# Patient Record
Sex: Male | Born: 1975 | Race: White | Hispanic: No | Marital: Married | Smoking: Former smoker
Health system: Southern US, Community
[De-identification: ages and names within clinical notes are randomized; demographics above are authoritative.]

## PROBLEM LIST (undated history)

## (undated) DIAGNOSIS — R011 Cardiac murmur, unspecified: Secondary | ICD-10-CM

## (undated) DIAGNOSIS — M5412 Radiculopathy, cervical region: Secondary | ICD-10-CM

## (undated) DIAGNOSIS — Z8709 Personal history of other diseases of the respiratory system: Secondary | ICD-10-CM

## (undated) DIAGNOSIS — I10 Essential (primary) hypertension: Secondary | ICD-10-CM

## (undated) DIAGNOSIS — J45909 Unspecified asthma, uncomplicated: Secondary | ICD-10-CM

## (undated) DIAGNOSIS — Z8739 Personal history of other diseases of the musculoskeletal system and connective tissue: Secondary | ICD-10-CM

## (undated) HISTORY — DX: Personal history of other diseases of the musculoskeletal system and connective tissue: Z87.39

## (undated) HISTORY — DX: Unspecified asthma, uncomplicated: J45.909

## (undated) HISTORY — PX: BACK SURGERY: SHX140

## (undated) HISTORY — DX: Cardiac murmur, unspecified: R01.1

## (undated) HISTORY — DX: Personal history of other diseases of the respiratory system: Z87.09

## (undated) HISTORY — DX: Radiculopathy, cervical region: M54.12

---

## 1998-12-22 ENCOUNTER — Emergency Department (HOSPITAL_COMMUNITY): Admission: EM | Admit: 1998-12-22 | Discharge: 1998-12-22 | Payer: Self-pay | Admitting: Emergency Medicine

## 1998-12-22 ENCOUNTER — Encounter: Payer: Self-pay | Admitting: Emergency Medicine

## 1999-01-25 ENCOUNTER — Emergency Department (HOSPITAL_COMMUNITY): Admission: EM | Admit: 1999-01-25 | Discharge: 1999-01-25 | Payer: Self-pay

## 1999-07-19 ENCOUNTER — Emergency Department (HOSPITAL_COMMUNITY): Admission: EM | Admit: 1999-07-19 | Discharge: 1999-07-19 | Payer: Self-pay | Admitting: Emergency Medicine

## 1999-08-08 ENCOUNTER — Emergency Department (HOSPITAL_COMMUNITY): Admission: EM | Admit: 1999-08-08 | Discharge: 1999-08-09 | Payer: Self-pay | Admitting: Emergency Medicine

## 2000-11-09 ENCOUNTER — Emergency Department (HOSPITAL_COMMUNITY): Admission: EM | Admit: 2000-11-09 | Discharge: 2000-11-09 | Payer: Self-pay | Admitting: *Deleted

## 2003-11-27 ENCOUNTER — Emergency Department (HOSPITAL_COMMUNITY): Admission: EM | Admit: 2003-11-27 | Discharge: 2003-11-27 | Payer: Self-pay | Admitting: Emergency Medicine

## 2007-11-02 ENCOUNTER — Emergency Department (HOSPITAL_COMMUNITY): Admission: EM | Admit: 2007-11-02 | Discharge: 2007-11-02 | Payer: Self-pay | Admitting: Emergency Medicine

## 2008-03-05 ENCOUNTER — Emergency Department (HOSPITAL_COMMUNITY): Admission: EM | Admit: 2008-03-05 | Discharge: 2008-03-05 | Payer: Self-pay | Admitting: Emergency Medicine

## 2008-10-19 ENCOUNTER — Emergency Department (HOSPITAL_BASED_OUTPATIENT_CLINIC_OR_DEPARTMENT_OTHER): Admission: EM | Admit: 2008-10-19 | Discharge: 2008-10-20 | Payer: Self-pay | Admitting: Emergency Medicine

## 2009-05-19 ENCOUNTER — Emergency Department (HOSPITAL_BASED_OUTPATIENT_CLINIC_OR_DEPARTMENT_OTHER): Admission: EM | Admit: 2009-05-19 | Discharge: 2009-05-19 | Payer: Self-pay | Admitting: Emergency Medicine

## 2011-04-10 NOTE — Consult Note (Signed)
NAME:  GREGOR, DERSHEM NO.:  [id number]   MEDICAL RECORD NO.:  [id number]          PATIENT TYPE:  EMS   LOCATION:  ED                            FACILITY:  [facility]   PHYSICIAN:  Dionne Ano. Gramig, M.D.DATE OF BIRTH:  Aug 20, 1976   DATE OF CONSULTATION:  10/20/2008  DATE OF DISCHARGE:  10/20/2008                                 CONSULTATION   I had the pleasure to see Elijah Pennington today in [facility] Emergency  Room.  Elijah Pennington is a pleasant male who is 35 years of age and was bit by  cat 24 hours ago about his left hypothenar and dorsal small finger MCP  region.  He has acute pain, swelling, and loss of function.  He was seen  by emergency room staff and I was asked to see in consult in regards to  his upper extremity predicament.  He notes no previous history of injury  or other problems.   PAST MEDICAL HISTORY:  He denies any significant past medical problems  of note.   Past surgical history is reviewed for minor operations only.   ALLERGIES:  PENICILLIN.   MEDICINES:  None.   SOCIAL HISTORY:  He works at a IT sales professional.   PHYSICAL EXAMINATION:  EXTREMITIES:  Normal right upper extremity  examination, lower extremity examination is benign.  CHEST:  Clear.  HEART:  Regular rate.  ABDOMEN:  Nontender.   The left upper extremity has hypothenar abscess from a cat bite and  dorsal scratches as well as a small abscess over MCP region which is  fairly superficial.  I have reviewed this at length.  His x-rays are  negative for fracture dislocation or specifying lesion.  There is no  frank Kanavel's signs but certainly on his exam, he has a deep abscess.   IMPRESSION:  Deep abscess secondary to cat bite 24 hours out.   PLAN:  I have discussed with him his findings.  We will send him for I&D  and repair as necessary.   He was taken to the procedure suite and underwent a ulnar nerve block at  the wrist level with lidocaine without  epinephrine.  He was then  scrubbed with Betadine scrub and paint and then he underwent an  incision.  The incision was performed over the cat bite.  There was  large amount of purulent material and this was cultured for aerobic and  anaerobic culture.  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   He was decompressed, nicely irrigated with multiple amounts of saline  and packed with iodoform gauze.  This was packed deeply into the  hypothenar region.   The patient had a similar procedure performed about the MCP joint,  however, this was only superficial.  The hypothenar I&D include the  skin, subcutaneous tissue, muscle, the MCP joint skin full thickness in  nature and a small amount of subcu.   The wounds were dressed sterilely.  There were no complicating features  with his surgical I&D and I have discussed with him doxycycline 100 mg  b.i.d. p.o.  as well as elevation, observation, Percocet for pain,  vitamin C, stool softener and return to the office to see me in 24 hours  for whirlpool and continued aggressive wound care.  We will call him to  arrange this immediately tomorrow.  I have discussed with him these  issues at length.  I would expect a long course for secondary intent  healing of the hypothenar region secondary to his injuries.  It was a  pleasure to see him today and all questions have been encouraged and  answered.      Dionne Ano. Amanda Pea, M.D.  Electronically Signed     WMG/MEDQ  D:  10/20/2008  T:  10/20/2008  Job:  [id number]

## 2011-08-28 LAB — CULTURE, ROUTINE-ABSCESS: Gram Stain: NONE SEEN

## 2012-07-18 ENCOUNTER — Encounter (HOSPITAL_BASED_OUTPATIENT_CLINIC_OR_DEPARTMENT_OTHER): Payer: Self-pay | Admitting: *Deleted

## 2012-07-18 ENCOUNTER — Emergency Department (HOSPITAL_BASED_OUTPATIENT_CLINIC_OR_DEPARTMENT_OTHER): Payer: 59

## 2012-07-18 ENCOUNTER — Emergency Department (HOSPITAL_BASED_OUTPATIENT_CLINIC_OR_DEPARTMENT_OTHER)
Admission: EM | Admit: 2012-07-18 | Discharge: 2012-07-18 | Disposition: A | Discharge status: Home / Self Care | Payer: 59 | Attending: Emergency Medicine | Admitting: Emergency Medicine

## 2012-07-18 DIAGNOSIS — S92919A Unspecified fracture of unspecified toe(s), initial encounter for closed fracture: Secondary | ICD-10-CM | POA: Insufficient documentation

## 2012-07-18 DIAGNOSIS — Z87891 Personal history of nicotine dependence: Secondary | ICD-10-CM | POA: Insufficient documentation

## 2012-07-18 DIAGNOSIS — S92912A Unspecified fracture of left toe(s), initial encounter for closed fracture: Secondary | ICD-10-CM

## 2012-07-18 DIAGNOSIS — X58XXXA Exposure to other specified factors, initial encounter: Secondary | ICD-10-CM | POA: Insufficient documentation

## 2012-07-18 DIAGNOSIS — Z880 Allergy status to penicillin: Secondary | ICD-10-CM | POA: Insufficient documentation

## 2012-07-18 MED ORDER — TRAMADOL HCL 50 MG PO TABS
50.0000 mg | ORAL_TABLET | Freq: Once | ORAL | Status: AC
  Administered 2012-07-18: 50 mg via ORAL
  Filled 2012-07-18: qty 1

## 2012-07-18 MED ORDER — TRAMADOL HCL 50 MG PO TABS: 50.0000 mg | ORAL_TABLET | Freq: Four times a day (QID) | ORAL | Status: AC | PRN

## 2012-07-18 NOTE — ED Notes (Signed)
Pt slipped while walking and now c/o left great toe pain and swelling.

## 2012-07-18 NOTE — ED Provider Notes (Signed)
History     CSN: [id number]  Arrival date & time 07/18/12  0054   First MD Initiated Contact with Patient 07/18/12 0116      Chief Complaint  Patient presents with  . Toe Pain    (Consider location/radiation/quality/duration/timing/severity/associated sxs/prior treatment) Patient is a 36 y.o. male presenting with toe pain. The history is provided by the patient. No language interpreter was used.  Toe Pain This is a new problem. The current episode started 3 to 5 hours ago. The problem occurs constantly. The problem has not changed since onset.Pertinent negatives include no chest pain, no abdominal pain, no headaches and no shortness of breath. Nothing aggravates the symptoms. Nothing relieves the symptoms. He has tried nothing for the symptoms. The treatment provided no relief.    History reviewed. No pertinent past medical history.  History reviewed. No pertinent past surgical history.  History reviewed. No pertinent family history.  History  Substance Use Topics  . Smoking status: Former Games developer  . Smokeless tobacco: Not on file  . Alcohol Use: Yes      Review of Systems  Respiratory: Negative for shortness of breath.   Cardiovascular: Negative for chest pain.  Gastrointestinal: Negative for abdominal pain.  Neurological: Negative for headaches.  All other systems reviewed and are negative.    Allergies  Penicillins  Home Medications  No current outpatient prescriptions on file.  BP 146/89  Pulse 88  Temp 98.9 F (37.2 C) (Oral)  Resp 18  Ht 6' (1.829 m)  Wt 200 lb (90.719 kg)  BMI 27.12 kg/m2  SpO2 99%  Physical Exam  Constitutional: He is oriented to person, place, and time. He appears well-developed and well-nourished.  HENT:  Head: Normocephalic and atraumatic.  Mouth/Throat: Oropharynx is clear and moist.  Eyes: Conjunctivae are normal. Pupils are equal, round, and reactive to light.  Neck: Normal range of motion. Neck supple.  Cardiovascular:  Normal rate and regular rhythm.   Pulmonary/Chest: Effort normal and breath sounds normal. He has no wheezes. He has no rales.  Abdominal: Soft. Bowel sounds are normal. There is no tenderness.  Musculoskeletal: He exhibits no edema.       FROM of the left great toe, cap refill to all toes of the left foot < 2 sec  Neurological: He is alert and oriented to person, place, and time.  Skin: Skin is warm and dry.  Psychiatric: He has a normal mood and affect.    ED Course  Procedures (including critical care time)  Labs Reviewed - No data to display Dg Toe Great Left  07/18/2012  *RADIOLOGY REPORT*  Clinical Data: Pain after fall.  LEFT GREAT TOE  Comparison: None.  Findings: Fracture of the base of the distal phalanx of the left first toe extending along the dorsal surface.  Fracture line extends to the interphalangeal joint surface.  Mild displacement of fracture fragments.  Soft tissue swelling.  IMPRESSION: Mildly displaced intra-articular fracture of the base of the distal phalanx of the left first toe.   Original Report Authenticated By: Marlon Pel, M.D.      No diagnosis found.    MDM  Follow up with foot specialist for ongoing care       Teshawn Moan K Essex Perry-Rasch, MD 07/18/12 [phone]

## 2012-08-25 ENCOUNTER — Ambulatory Visit (INDEPENDENT_AMBULATORY_CARE_PROVIDER_SITE_OTHER): Payer: 59 | Admitting: Internal Medicine

## 2012-08-25 ENCOUNTER — Encounter: Payer: Self-pay | Admitting: Internal Medicine

## 2012-08-25 VITALS — BP 114/78 | HR 73 | Temp 98.3°F | Ht 72.0 in | Wt 224.0 lb

## 2012-08-25 DIAGNOSIS — Z0000 Encounter for general adult medical examination without abnormal findings: Secondary | ICD-10-CM

## 2012-08-25 DIAGNOSIS — S92912A Unspecified fracture of left toe(s), initial encounter for closed fracture: Secondary | ICD-10-CM

## 2012-08-25 DIAGNOSIS — Z23 Encounter for immunization: Secondary | ICD-10-CM

## 2012-08-25 LAB — CBC WITH DIFFERENTIAL/PLATELET
Basophils Absolute: 0 10*3/uL (ref 0.0–0.1)
Eosinophils Absolute: 0.4 10*3/uL (ref 0.0–0.7)
MCHC: 33.6 g/dL (ref 30.0–36.0)
MCV: 97.4 fl (ref 78.0–100.0)
Monocytes Absolute: 0.3 10*3/uL (ref 0.1–1.0)
Neutrophils Relative %: 48.6 % (ref 43.0–77.0)
Platelets: 172 10*3/uL (ref 150.0–400.0)
WBC: 7.2 10*3/uL (ref 4.5–10.5)

## 2012-08-25 LAB — COMPREHENSIVE METABOLIC PANEL
Alkaline Phosphatase: 63 U/L (ref 39–117)
Glucose, Bld: 105 mg/dL — ABNORMAL HIGH (ref 70–99)
Sodium: 137 mEq/L (ref 135–145)
Total Bilirubin: 0.5 mg/dL (ref 0.3–1.2)
Total Protein: 7 g/dL (ref 6.0–8.3)

## 2012-08-25 LAB — LIPID PANEL
Cholesterol: 198 mg/dL (ref 0–200)
LDL Cholesterol: 123 mg/dL — ABNORMAL HIGH (ref 0–99)
Triglycerides: 110 mg/dL (ref 0.0–149.0)
VLDL: 22 mg/dL (ref 0.0–40.0)

## 2012-08-25 NOTE — Patient Instructions (Signed)
Nasonex 2 sprays on each side of the nose every night, if the pain is not better in a couple of weeks, please see your dentist, TMJ problem?

## 2012-08-25 NOTE — Assessment & Plan Note (Addendum)
[id number] XR:  Mildly displaced intra-articular fracture of the base of the distal phalanx of the left first toe.  Was recommended to see orthopedic surgery but didn't, offered a referral--- will do

## 2012-08-25 NOTE — Assessment & Plan Note (Addendum)
Td ~ 2009 per pt  Flu shot today rec a healthy life style, diet, STE discussed Also, we discussed his right ear pain, due to mild nasal congestion? TMJ? Plan: Samples of nasonex  If not better needs to see his dentist (TMJ? grinding a lot per pt )

## 2012-08-25 NOTE — Progress Notes (Signed)
  Subjective:    Patient ID: Elijah Pennington, male    DOB: 1976/01/28, 36 y.o.   MRN: [id number]  HPI CPX  Past medical history Asthma as a child, no recent episodes Allergies Heart murmur remotely  Gout remotely   Past surgical history None  Social history Married, 2 Archivist, Airline pilot  tobacco-- former, quit 2012, now E cigarrets ETOH-- socially  Drugs-- denies Exercise 3 times a week, home gym , walks   Family history Diabetes-- no CAD--noSudden death? GF (no details) Stroke--GF, uncle  HTN-- F M Colon cancer-- no Breast cancer-- GM Prostate cancer-- no   Review of Systems Had a fx a few weeks ago , left great toe, still hurt some. Having right ear pain for 2 weeks, mild, denies any discharge. 2 days ago developed a mild right-sided sore throat. Admits to mild runny nose with occasional nasal discharge. Denies chest congestion and cough No chest pain or shortness of breath No nausea, vomiting, diarrhea or blood in the stools No dysuria or  difficulty urinating.     Objective:   Physical Exam  General -- alert, well-developed, and well-nourished.   Neck --no thyromegaly  HEENT--  TMs normal, nose slightly congested, face symmetric and not tender to palpation. Throat without redness or discharge, uvula midline. TMJ without clicks, slightly tender on the right? Lungs -- normal respiratory effort, no intercostal retractions, no accessory muscle use, and normal breath sounds.   Heart-- normal rate, regular rhythm, no murmur, and no gallop.   Abdomen--soft, non-tender, no distention, no masses, no HSM, no guarding, and no rigidity.   Extremities-- no pretibial edema bilaterally; right total normal, left a slightly swollen, range of motion slightly decreased, no obvious deformities. Neurologic-- alert & oriented X3 and strength normal in all extremities. Psych-- Cognition and judgment appear intact. Alert and cooperative with normal attention span and  concentration.  not anxious appearing and not depressed appearing.       Assessment & Plan:

## 2012-08-26 ENCOUNTER — Encounter: Payer: Self-pay | Admitting: *Deleted

## 2012-11-26 DIAGNOSIS — M5412 Radiculopathy, cervical region: Secondary | ICD-10-CM

## 2012-11-26 HISTORY — DX: Radiculopathy, cervical region: M54.12

## 2013-03-02 ENCOUNTER — Ambulatory Visit (INDEPENDENT_AMBULATORY_CARE_PROVIDER_SITE_OTHER): Payer: 59 | Admitting: Internal Medicine

## 2013-03-02 ENCOUNTER — Encounter: Payer: Self-pay | Admitting: Internal Medicine

## 2013-03-02 VITALS — BP 120/84 | HR 68 | Temp 98.6°F | Wt 216.0 lb

## 2013-03-02 DIAGNOSIS — M25521 Pain in right elbow: Secondary | ICD-10-CM

## 2013-03-02 DIAGNOSIS — M25529 Pain in unspecified elbow: Secondary | ICD-10-CM | POA: Insufficient documentation

## 2013-03-02 MED ORDER — PREDNISONE 10 MG PO TABS: ORAL_TABLET | ORAL | Status: DC

## 2013-03-02 NOTE — Assessment & Plan Note (Signed)
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 He is interested in seeing physical therapy to help with posture and overuse syndrome management. Prednisone for anti-inflammation. Will call if not improving.

## 2013-03-02 NOTE — Patient Instructions (Addendum)
We are sending you to a physical therapist Take prednisone as prescribed for few days Call if no better

## 2013-03-02 NOTE — Progress Notes (Signed)
  Subjective:    Patient ID: Elijah Pennington, male    DOB: February 12, 1976, 36 y.o.   MRN: [id number]  HPI Acute visit 2 months ago developed a mild right shoulder pain, a month ago also started to experience pain at the tip of the right elbow as well as numbness in his fingers mostly at the fourth and fifth finger.   Past Medical History  Diagnosis Date  . H/O intrinsic asthma   . H/O: gout   . Heart murmur     h/o   Past Surgical History  Procedure Laterality Date  . No past surgeries     Social history  Married, 2 Metallurgist, Airline pilot  tobacco-- former, quit 2012, now E cigarrets  ETOH-- socially  Drugs-- denies    Review of Systems Denies any swelling or redness in the elbow. The one repetitive motion  he does is typing several hours a day and he also plays the guitarr. No neck pain.     Objective:   Physical Exam General -- alert, well-developed    Neck --full range of motion, nontender to palpation Extremities--  Shoulder range of motion normal. Inspection and  palpation of the elbows, wrists and hands normal Neurologic-- alert & oriented X3 ; speech gait and motor are intact. DTRs symmetric. Pinprick examination of the upper extremities normal. Psych-- Cognition and judgment appear intact. Alert and cooperative with normal attention span and concentration.  not anxious appearing and not depressed appearing.      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 He is interested in seeing physical therapy to help with posture and overuse syndrome management. Prednisone for anti-inflammation. Will call if not improving.

## 2013-03-30 ENCOUNTER — Telehealth: Payer: Self-pay | Admitting: Internal Medicine

## 2013-03-30 NOTE — Telephone Encounter (Signed)
Pt came in & filled out a walk-in form requesting a prescription for a tens machine. Please advise.

## 2013-03-30 NOTE — Telephone Encounter (Signed)
error 

## 2013-03-30 NOTE — Telephone Encounter (Signed)
I don't Rx TENs unit, needs to see PT

## 2013-03-31 NOTE — Telephone Encounter (Signed)
Left detailed msg on pt's vmail making pt aware.  

## 2013-04-03 NOTE — Telephone Encounter (Signed)
Pt wife called back again today in regards to this matter. She was given the same message. Stated PT was under the impression he should get the TENS unit from [place]. Notified pt to have PT send a request if this is indeed needed.

## 2013-08-04 HISTORY — PX: OTHER SURGICAL HISTORY: SHX169

## 2013-08-25 ENCOUNTER — Ambulatory Visit (INDEPENDENT_AMBULATORY_CARE_PROVIDER_SITE_OTHER): Payer: 59 | Admitting: Family Medicine

## 2013-08-25 VITALS — BP 122/78 | HR 85 | Temp 99.0°F | Resp 17 | Ht 71.5 in | Wt 217.0 lb

## 2013-08-25 DIAGNOSIS — Z0000 Encounter for general adult medical examination without abnormal findings: Secondary | ICD-10-CM

## 2013-08-25 DIAGNOSIS — Z23 Encounter for immunization: Secondary | ICD-10-CM

## 2013-08-25 NOTE — Progress Notes (Signed)
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 He has no major acute complaints.  Past medical history: Medical illnesses: History of childhood asthma, on and off in adulthood when he was smoking. Surgical history: Recent C-spine surgery 3 weeks ago for a desk with right radiculopathy. Doing well Regular medications: Postop is taking hydrocodone and Flexeril Medication allergies: Penicillin  Family history: Mother 14 with low blood pressure Father 31 has glaucoma and has had back surgery Brother has ulcerative colitis 2 sons 39 and 24-year-old  Social history: Works at a Museum/gallery curator as Transport planner. He is married with 2 sons as noted. He has not exercising as much faithfully lately because of his desk and arm problems, but flex to walk and swim. He has college education. Does not smoke, having recently quit before surgery. Drinks 6-12 beers or occasionally drink a week. Does not use drugs.  Review of systems: Constitutional: Unremarkable HEENT: Unremarkable except for of the neck pain but she still has some postop Eyes: Unremarkable Respiratory: Unremarkable Cardiovascular: Unremarkable Gastrointestinal: Unremarkable Muscular skeletal: Unremarkable Dermatologic: Unremarkable Neurologic: Had the ulnar radiculopathy which has resolved Hematologic: Unremarkable Psychiatric: Unremarkable Endocrinologic: Unremarkable   Physical examination: Well-developed well-nourished white male in no acute distress. TMs are normal. Eyes PERRLA. Fundi benign discs flat. EOMs intact. Throat clear. Teeth good. Neck suppl without nodes thyromegaly. Chest clear to auscultation. Heart regular without murmurs gallops or arrhythmias. Was told he had a murmurs a child the pediatrician, do not hear one. Abdomen soft without masses tenderness. Normal male external genitalia, circumcised. Testes descended. No hernias.  Spine intact. Skin unremarkable. Deep tender reflexes 1-2+ symmetrical in ankles and knees  Assessment: Normal annual physical examination  Plan: No form was necessary at this time. Check lipids and comprehensive metabolic panel Return if problems

## 2013-08-25 NOTE — Patient Instructions (Signed)
Return if problems

## 2013-08-26 LAB — LIPID PANEL: LDL Cholesterol: 156 mg/dL — ABNORMAL HIGH (ref 0–99)

## 2013-08-26 LAB — COMPREHENSIVE METABOLIC PANEL
AST: 30 U/L (ref 0–37)
Albumin: 4.4 g/dL (ref 3.5–5.2)
Alkaline Phosphatase: 72 U/L (ref 39–117)
Potassium: 4.4 mEq/L (ref 3.5–5.3)
Sodium: 136 mEq/L (ref 135–145)
Total Protein: 7.2 g/dL (ref 6.0–8.3)

## 2013-08-27 ENCOUNTER — Encounter: Payer: Self-pay | Admitting: Family Medicine

## 2016-01-26 ENCOUNTER — Ambulatory Visit (INDEPENDENT_AMBULATORY_CARE_PROVIDER_SITE_OTHER): Payer: 59 | Admitting: Medical

## 2016-01-26 ENCOUNTER — Encounter: Payer: Self-pay | Admitting: Medical

## 2016-01-26 VITALS — BP 118/80 | HR 103 | Temp 98.6°F | Ht 71.5 in | Wt 221.0 lb

## 2016-01-26 DIAGNOSIS — M542 Cervicalgia: Secondary | ICD-10-CM

## 2016-01-26 DIAGNOSIS — M792 Neuralgia and neuritis, unspecified: Secondary | ICD-10-CM

## 2016-01-26 DIAGNOSIS — R1013 Epigastric pain: Secondary | ICD-10-CM | POA: Diagnosis not present

## 2016-01-26 MED ORDER — GABAPENTIN 100 MG PO CAPS: 100.0000 mg | ORAL_CAPSULE | Freq: Three times a day (TID) | ORAL | Status: DC

## 2016-01-26 MED ORDER — OMEPRAZOLE 20 MG PO TBEC: 20.0000 mg | DELAYED_RELEASE_TABLET | Freq: Two times a day (BID) | ORAL | Status: DC

## 2016-01-26 MED ORDER — PREDNISONE 10 MG PO TABS: ORAL_TABLET | ORAL | Status: DC

## 2016-01-26 MED ORDER — KETOROLAC TROMETHAMINE 60 MG/2ML IM SOLN
60.0000 mg | Freq: Once | INTRAMUSCULAR | Status: AC
  Administered 2016-01-26: 60 mg via INTRAMUSCULAR

## 2016-01-26 NOTE — Patient Instructions (Addendum)
For your neck pain and radicular pain we gave you toradol 60 mg im. Rx of neurontin.  Start low dose taper prednisone tomorrow.  You mentioned you have tramadol at your home. You could use 1 tab po every 6 hours in conjunction with the above.  Will refer to neurologist at your request.  For abdomen pain will get labs today and rx omeprazole. If your pain worsens or changes notify us. If severe and after hours then ED evaluation.  Follow up in 7 days or as needed

## 2016-01-26 NOTE — Progress Notes (Signed)
Pre visit review using our clinic review tool, if applicable. No additional management support is needed unless otherwise documented below in the visit note. 

## 2016-01-26 NOTE — Progress Notes (Signed)
Subjective:    Patient ID: Elijah Pennington, male    DOB: 02/03/1976, 40 y.o.   MRN: [id number]  HPI  Pt states hx of neck surgery for 2.5 years ago. Pt states last year he had some reoccuring pain and he epidural which helped for a couple of months. Some intermittent lingering pain in his neck since then. But last 2 days he had fever, diarrhea and vomiting. He thinks with vomiting and pressure may have reinjured his neck. (but now his acute GI symptoms have resolved no today)  Pt has seen Dr. Modesto Charon at [facility] orthopedist. Pt states after epidural his specialist office plans  were to offer  epidural 3-4 times a year.  Pt indicates not happy with that plan.  Pt states some likley has c5-c6 issue. He states some radiating pain to rt arm. He states same distribution of radiating pain prior to epidural. This got acutely worse with vomiting episodes 2 days ago.  Since epidural he states most of time given muscle relaxant.  Pt states he was never on gabapentin.  Pt at very end wanted me to check his abdomen. Pain for one year on and off(different from recent gastroenteritis type illness). Sometimes after eating. Sometimes stress related . No black stools. Pt thinks ranitidine helps at times. No nausea or vomiting,   Review of Systems  Constitutional: Negative for chills and fatigue.  Respiratory: Negative for cough, shortness of breath and wheezing.   Cardiovascular: Negative for chest pain and palpitations.  Gastrointestinal: Positive for abdominal pain. Negative for nausea, vomiting, diarrhea, constipation, blood in stool, abdominal distention, anal bleeding and rectal pain.       History of but not acute presently.  Musculoskeletal: Positive for neck pain.       Radicular pain  Skin: Negative for rash.  Neurological:       Radicular pain  Hematological: Negative for adenopathy. Does not bruise/bleed easily.  Psychiatric/Behavioral: Negative for behavioral problems and confusion.     Past Medical History  Diagnosis Date  . H/O intrinsic asthma   . H/O: gout   . Heart murmur     h/o  . Asthma   . Radiculopathy of cervical region 2016    Secondary to new disc protrusion, s/p C6-7 ACDF 08/04/2013    Social History   Social History  . Marital Status: Single    Spouse Name: N/A  . Number of Children: N/A  . Years of Education: N/A   Occupational History  . Not on file.   Social History Main Topics  . Smoking status: Former Games developer  . Smokeless tobacco: Not on file  . Alcohol Use: Yes  . Drug Use: No  . Sexual Activity: Not on file   Other Topics Concern  . Not on file   Social History Narrative    Past Surgical History  Procedure Laterality Date  . Anterior cervical discectomy and fusion  08/04/2013    Family History  Problem Relation Age of Onset  . Glaucoma Father   . Colitis Brother     Allergies  Allergen Reactions  . Penicillins     Current Outpatient Prescriptions on File Prior to Visit  Medication Sig Dispense Refill  . cyclobenzaprine (FLEXERIL) 10 MG tablet Take 10 mg by mouth 3 (three) times daily as needed for muscle spasms.     No current facility-administered medications on file prior to visit.    BP 118/80 mmHg  Pulse 103  Temp(Src) 98.6 F (37 C) (Oral)  Ht 5' 11.5" (1.816 m)  Wt 221 lb (100.245 kg)  BMI 30.40 kg/m2  SpO2 96%       Objective:   Physical Exam  General Mental Status- Alert. General Appearance- Not in acute distress.   Skin General: Color- Normal Color. Moisture- Normal Moisture.  Neck Carotid Arteries- mid cspine pain lower portion. When he turns head to the left will have pain that radiates down left arm.  Chest and Lung Exam Auscultation: Breath Sounds:-Normal.  Cardiovascular Auscultation:Rythm- Regular. Murmurs & Other Heart Sounds:Auscultation of the heart reveals- No Murmurs.  Abdomen Inspection:-Inspeection Normal. Palpation/Percussion:Note:No mass. Palpation and  Percussion of the abdomen reveal- Non Tender, Non Distended + BS, no rebound or guarding.  Rt shoulder- good rom. But when abducts rt upper ext then has  neck pain. And pain will radiate down his arm from his neck.   Neurologic Cranial Nerve exam:- CN III-XII intact(No nystagmus), symmetric smile. Strength:- 5/5 equal and symmetric strength both upper and lower extremities.      Assessment & Plan:  For your neck pain and radicular pain we gave you toradol 60 mg im. Rx of neurontin.  Start low dose taper prednisone tomorrow.  You mentioned you have tramadol at your home. You could use 1 tab po every 6 hours in conjunction with the above.  Will refer to neurologist at your request.  For abdomen pain will get labs today and rx omeprazole. If your pain worsens or changes notify us. If severe and after hours then ED evaluation.  Follow up in 7 days or as needed

## 2016-01-27 LAB — COMPREHENSIVE METABOLIC PANEL
ALT: 28 U/L (ref 0–53)
AST: 24 U/L (ref 0–37)
Albumin: 4.4 g/dL (ref 3.5–5.2)
Alkaline Phosphatase: 71 U/L (ref 39–117)
BUN: 14 mg/dL (ref 6–23)
CALCIUM: 9.4 mg/dL (ref 8.4–10.5)
CHLORIDE: 103 meq/L (ref 96–112)
CO2: 31 meq/L (ref 19–32)
Creatinine, Ser: 0.95 mg/dL (ref 0.40–1.50)
GFR: 93.42 mL/min (ref >60.00)
Glucose, Bld: 94 mg/dL (ref 70–99)
Potassium: 3.9 mEq/L (ref 3.5–5.1)
Sodium: 140 mEq/L (ref 135–145)
Total Bilirubin: 0.4 mg/dL (ref 0.2–1.2)
Total Protein: 7.4 g/dL (ref 6.0–8.3)

## 2016-01-27 LAB — CBC WITH DIFFERENTIAL/PLATELET
BASOS PCT: 0.7 % (ref 0.0–3.0)
Basophils Absolute: 0 10*3/uL (ref 0.0–0.1)
EOS ABS: 0.4 10*3/uL (ref 0.0–0.7)
Eosinophils Relative: 5.5 % — ABNORMAL HIGH (ref 0.0–5.0)
HEMATOCRIT: 46.1 % (ref 39.0–52.0)
Hemoglobin: 15.8 g/dL (ref 13.0–17.0)
LYMPHS ABS: 2.2 10*3/uL (ref 0.7–4.0)
LYMPHS PCT: 33.7 % (ref 12.0–46.0)
MCHC: 34.3 g/dL (ref 30.0–36.0)
MCV: 92.4 fl (ref 78.0–100.0)
MONOS PCT: 9.8 % (ref 3.0–12.0)
Monocytes Absolute: 0.6 10*3/uL (ref 0.1–1.0)
NEUTROS ABS: 3.3 10*3/uL (ref 1.4–7.7)
NEUTROS PCT: 50.3 % (ref 43.0–77.0)
PLATELETS: 203 10*3/uL (ref 150.0–400.0)
RBC: 4.99 Mil/uL (ref 4.22–5.81)
RDW: 13.2 % (ref 11.5–15.5)
WBC: 6.5 10*3/uL (ref 4.0–10.5)

## 2016-01-27 LAB — LIPASE: LIPASE: 26 U/L (ref 11.0–59.0)

## 2016-01-27 LAB — H. PYLORI BREATH TEST: H. PYLORI BREATH TEST: NOT DETECTED

## 2016-01-27 LAB — AMYLASE: Amylase: 36 U/L (ref 27–131)

## 2016-02-16 ENCOUNTER — Encounter: Payer: Self-pay | Admitting: Neurology

## 2016-02-16 ENCOUNTER — Ambulatory Visit (INDEPENDENT_AMBULATORY_CARE_PROVIDER_SITE_OTHER): Payer: 59 | Admitting: Neurology

## 2016-02-16 VITALS — BP 120/88 | HR 82 | Ht 71.5 in | Wt 222.3 lb

## 2016-02-16 DIAGNOSIS — Z981 Arthrodesis status: Secondary | ICD-10-CM | POA: Diagnosis not present

## 2016-02-16 DIAGNOSIS — M5412 Radiculopathy, cervical region: Secondary | ICD-10-CM | POA: Diagnosis not present

## 2016-02-16 DIAGNOSIS — G5621 Lesion of ulnar nerve, right upper limb: Secondary | ICD-10-CM | POA: Diagnosis not present

## 2016-02-16 MED ORDER — CYCLOBENZAPRINE HCL 5 MG PO TABS: 5.0000 mg | ORAL_TABLET | Freq: Two times a day (BID) | ORAL | Status: DC | PRN

## 2016-02-16 MED ORDER — GABAPENTIN 300 MG PO CAPS: ORAL_CAPSULE | ORAL | Status: DC

## 2016-02-16 NOTE — Progress Notes (Signed)
[facility] Neurology Division Clinic Note - Initial Visit   Date: 02/16/2016  Elijah Pennington MRN: [id number] DOB: 1976-07-30   Dear Dr. Drue Novel:  Thank you for your kind referral of Elijah Pennington for consultation of right arm pain. Although her history is well known to you, please allow [place] to reiterate it for the purpose of our medical record. The patient was accompanied to the clinic by self.    History of Present Illness: Elijah Pennington is a 40 y.o. right-handed Caucasian male with s/p C6-7 ACDF (2016) presenting for evaluation of right arm radicular pain.    In early 2014, he started having numbness over the right upper arm, forearm, and last two fingers.  Imaging shows disc hernation at C6-7 and he underwent ACDF at this level which alleviated his symptoms for at least 1.5 years. He feels that since the surgery, his pinky and ring finger are more clumsy and weak. Around the summer 2016, he began noticing recurrence of the same symptoms.  He occasionally has left sided neck pain, but nothing that radiates down his arm.   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  He is seeing Dr. Yevette Edwards for his chronic neck discomfort.  He underwent ESI in October 2016 by Dr. Modesto Charon which helped for about a month.   In early March, he had flu-like symptoms and was vomiting a lot.  He reports vomiting violently and following this, he developed immediate right sided neck pain and tingling radiating down his right arm and into his thumb.  He saw his PCP who started him on gabapentin  twice daily and flexeril  twice daily and has noticed 40% improvement. He does not notice any benefit with NSAIDs or tramadol.  He has previously done neck PT in January 2017 and felt traction helped the most.   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   Out-side paper records, electronic medical record, and images have been reviewed where available and  summarized as:  XR cervical spine 11/09/2000:  Normal  Lab Results  Component Value Date   TSH 1.14 08/25/2012   Lab Results  Component Value Date   CHOL 236* 08/25/2013   HDL 49 08/25/2013   LDLCALC 156* 08/25/2013   TRIG 157* 08/25/2013   CHOLHDL 4.8 08/25/2013    Past Medical History  Diagnosis Date  . H/O intrinsic asthma   . H/O: gout   . Heart murmur     h/o  . Asthma   . Radiculopathy of cervical region 2016    Secondary to new disc protrusion, s/p C6-7 ACDF 08/04/2013    Past Surgical History  Procedure Laterality Date  . Anterior cervical discectomy and fusion  08/04/2013     Medications:  Outpatient Encounter Prescriptions as of 02/16/2016  Medication Sig  . cyclobenzaprine (FLEXERIL) 10 MG tablet Take 5 mg by mouth 3 (three) times daily as needed for muscle spasms.   Marland Kitchen gabapentin (NEURONTIN) 100 MG capsule Take 1 capsule (100 mg total) by mouth 3 (three) times daily.  . Omeprazole 20 MG TBEC Take 1 tablet (20 mg total) by mouth 2 (two) times daily.  . [DISCONTINUED] predniSONE (DELTASONE) 10 MG tablet 5 tab po day 1, 4 tab po day 2, 3 tab po day 3, 2 tab po day 4, 1 tab po day 5.   No facility-administered encounter medications on file as of 02/16/2016.     Allergies:  Allergies  Allergen Reactions  . Penicillins     Family History: Family History  Problem Relation Age of Onset  . Glaucoma Father   . Colitis Brother     Social History: Social History  Substance Use Topics  . Smoking status: Former Games developermoker  . Smokeless tobacco: Never Used  . Alcohol Use: 0.0 oz/week    0 Standard drinks or equivalent per week   Social History   Social History Narrative   Lives with wife and children.  Has 3 children.  Works at the [employer].  Education: college.    Review of Systems:  CONSTITUTIONAL: No fevers, chills, night sweats, or weight loss.   EYES: No visual changes or eye pain ENT: No hearing changes.  No history of nose bleeds.     RESPIRATORY: No cough, wheezing and shortness of breath.   CARDIOVASCULAR: Negative for chest pain, and palpitations.   GI: Negative for abdominal discomfort, blood in stools or black stools.  No recent change in bowel habits.   GU:  No history of incontinence.   MUSCLOSKELETAL: No history of joint pain or swelling.  No myalgias.   SKIN: Negative for lesions, rash, and itching.   HEMATOLOGY/ONCOLOGY: Negative for prolonged bleeding, bruising easily, and swollen nodes.  No history of cancer.   ENDOCRINE: Negative for cold or heat intolerance, polydipsia or goiter.   PSYCH:  No depression or anxiety symptoms.   NEURO: As Above.   Vital Signs:  BP 120/88 mmHg  Pulse 82  Ht 5' 11.5" (1.816 m)  Wt 222 lb 5 oz (100.84 kg)  BMI 30.58 kg/m2  SpO2 97%10   General Medical Exam:   General:  Well appearing, comfortable.   Eyes/ENT: see cranial nerve examination.   Neck: No masses appreciated.  Full range of motion without tenderness.  No carotid bruits. Respiratory:  Clear to auscultation, good air entry bilaterally.   Cardiac:  Regular rate and rhythm, no murmur.   Extremities:  No deformities, edema, or skin discoloration.  Skin:  No rashes or lesions.  Neurological Exam: MENTAL STATUS including orientation to time, place, person, recent and remote memory, attention span and concentration, language, and fund of knowledge is normal.  Speech is not dysarthric.  CRANIAL NERVES: II:  No visual field defects.  Unremarkable fundi.   III-IV-VI: Pupils equal round and reactive to light.  Normal conjugate, extra-ocular eye movements in all directions of gaze.  No nystagmus.  No ptosis.   V:  Normal facial sensation.     VII:  Normal facial symmetry and movements.  No pathologic facial reflexes.  VIII:  Normal hearing and vestibular function.   IX-X:  Normal palatal movement.   XI:  Normal shoulder shrug and head rotation.   XII:  Normal tongue strength and range of motion, no deviation or  fasciculation.  MOTOR:  No atrophy, fasciculations or abnormal movements.  No pronator drift.  Tone is normal.    Right Upper Extremity:    Left Upper Extremity:    Deltoid  5/5   Deltoid  5/5   Biceps  5/5   Biceps  5/5   Triceps  5/5   Triceps  5/5   Wrist extensors  5/5   Wrist extensors  5/5   Wrist flexors  5/5   Wrist flexors  5/5   Finger extensors  5/5   Finger extensors  5/5   Finger flexors  5/5   Finger flexors  5/5   Dorsal interossei  5/5  Dorsal interossei  5/5   Abductor pollicis  5/5   Abductor pollicis  5/5   Tone (Ashworth scale)  0  Tone (Ashworth scale)  0   Right Lower Extremity:    Left Lower Extremity:    Hip flexors  5/5   Hip flexors  5/5   Hip extensors  5/5   Hip extensors  5/5   Knee flexors  5/5   Knee flexors  5/5   Knee extensors  5/5   Knee extensors  5/5   Dorsiflexors  5/5   Dorsiflexors  5/5   Plantarflexors  5/5   Plantarflexors  5/5   Toe extensors  5/5   Toe extensors  5/5   Toe flexors  5/5   Toe flexors  5/5   Tone (Ashworth scale)  0  Tone (Ashworth scale)  0   MSRs:  Right                                                                 Left brachioradialis 3+  brachioradialis 3+  biceps 3+  biceps 3+  triceps 2+  triceps 2+  patellar 2+  patellar 2+  ankle jerk 2+  ankle jerk 2+  Hoffman no  Hoffman no  plantar response down  plantar response down   SENSORY: Reduced pin prick over the C6 dermatome involving the hand only.  Otherwise, normal and symmetric perception of light touch, pinprick, vibration, and proprioception.  Romberg's sign absent.   COORDINATION/GAIT: Normal finger-to- nose-finger.  Intact rapid alternating movements bilaterally.  Able to rise from a chair without using arms.  Gait narrow based and stable. Tandem and stressed gait intact.    IMPRESSION: 1.  Right cervical radiculopathy s/p C6-7 ACDF (2014) now presenting with worsening neck and radicular pain in the setting of increased valsalva while vomiting earlier  this month.  I do not have his recent MRI cervical spine to review, but I explained that if there is disc herniation present, he can have worsening pain with anything that stresses his neck such as straining and lifting.  I will request the images to be forwarded to me for review.  In the meantime, we discuss conservative therapies including optimizing his medication regimen and neck PT.  2.  Right hand paresthesias over the 4th and 5th digits, likely due to ulnar neuropathy.  NCS/EMG will be ordered to confirm.  I explained that his distribution of paresthesias does not correspond with his prior surgery at C6 so is unlikely to be related.  PLAN/RECOMMENDATIONS:  1.  NCS/EMG of the right arm 2.  Start gabapentin  at bedtime x 1 week, then increase to  twice daily 3.  Continue flexeril  twice daily prn neck pain 4.  Request MRI report from [facility] 5.  He will do his own neck PT and traction at home.    Return to clinic in 3 months.   The duration of this appointment visit was 40 minutes of face-to-face time with the patient.  Greater than 50% of this time was spent in counseling, explanation of diagnosis, planning of further management, and coordination of care.   Thank you for allowing me to participate in patient's care.  If I can answer any additional questions, I would be  pleased to do so.    Sincerely,    Matalynn Graff K. Posey Pronto, DO

## 2016-02-16 NOTE — Patient Instructions (Signed)
1.  Start gabapentin 300mg  at bedtime for one week, then increase to 1 tablet twice daily.  You can start taking 100mg  tablets in the morning to be sure you are not too sleepy on the higher dose before going to 300mg  twice daily. 2.  Continue flexeril 5mg  twice daily as needed 3.  NCS/EMG of the right arm 4.  We will request your previous MRI report  Return to clinic 303-months

## 2016-02-21 ENCOUNTER — Encounter: Payer: 59 | Admitting: Neurology

## 2016-02-21 DIAGNOSIS — Z029 Encounter for administrative examinations, unspecified: Secondary | ICD-10-CM

## 2016-02-22 ENCOUNTER — Other Ambulatory Visit: Payer: Self-pay | Admitting: Medical

## 2016-02-28 ENCOUNTER — Ambulatory Visit (INDEPENDENT_AMBULATORY_CARE_PROVIDER_SITE_OTHER): Payer: 59 | Admitting: Neurology

## 2016-02-28 ENCOUNTER — Telehealth: Payer: Self-pay | Admitting: Neurology

## 2016-02-28 DIAGNOSIS — M5412 Radiculopathy, cervical region: Secondary | ICD-10-CM

## 2016-02-28 DIAGNOSIS — G5621 Lesion of ulnar nerve, right upper limb: Secondary | ICD-10-CM

## 2016-02-28 NOTE — Telephone Encounter (Signed)
MRI cervical spine 07/25/2015 [facility]: 1.  Uncovertebral disease and facet arthropathy at C5-6 causing mild to moderate right foraminal narrowing. The central canal and left foramen are open. 2.  Uncovertebral disease on the right at C3-4 causing mild foraminal narrowing. The central canal and left foramen are widely patent. 3.  Status post C6-7 discectomy and fusion. The central canal and foramina are widely patent.  EMG results discussed with patient which shows mild ulnar neuropathy across the elbow and chronic C5 radiculopathy. He will continue traction exercises at home and I have recommended using a soft elbow pad and avoiding hyperflexion of the elbow.  Donika K. [name], DO

## 2016-02-28 NOTE — Procedures (Signed)
[facility]  [address], [address]  [place], [place] [address] Tel: [phone] Fax:  [phone] Test Date:  02/28/2016  Patient: Elijah Pennington DOB: 1976/07/04 Physician: Nita Sickleonika Patel, DO  Sex: Male Height: 5\' 11"  Ref Phys: Nita Sickleonika Patel, DO  ID#: [id number] Temp: 33.4C Technician: Judie PetitM. Dean   Patient Complaints: This is a 40 year old gentleman referred for evaluation of right-sided neck pain and paresthesias of the hand.  NCV & EMG Findings: Extensive electrodiagnostic testing of the right upper extremity shows: 1. Right median and ulnar sensory responses are within normal limits. 2. Right ulnar motor response shows absolute conduction velocity slowing across the elbow with normal amplitude and latency. Right median motor responses within normal limits. 3. Chronic motor axon loss changes are isolated to the first dorsal interosseous and deltoid muscles, without accompanied active denervation.  Impression: 1. Right ulnar neuropathy with slowing across the elbow, mild in degree electrically. 2. There is also evidence of the residuals of a chronic C5 radiculopathy, very mild in degree electrically.   ___________________________ Nita Sickleonika Patel, DO    Nerve Conduction Studies Anti Sensory Summary Table   Site NR Peak (ms) Norm Peak (ms) P-T Amp (V) Norm P-T Amp  Right Median Anti Sensory (2nd Digit)  Wrist    2.9 <3.4 27.5 >20  Right Ulnar Anti Sensory (5th Digit)  Wrist    3.1 <3.1 25.2 >12   Motor Summary Table   Site NR Onset (ms) Norm Onset (ms) O-P Amp (mV) Norm O-P Amp Site1 Site2 Delta-0 (ms) Dist (cm) Vel (m/s) Norm Vel (m/s)  Right Median Motor (Abd Poll Brev)  Wrist    2.8 <3.9 9.9 >6 Elbow Wrist 5.0 26.0 52 >50  Elbow    7.8  9.3         Right Ulnar Motor (Abd Dig Minimi)  Wrist    2.7 <3.1 9.8 >7 B Elbow Wrist 4.2 26.0 62 >50  B Elbow    6.9  9.2  A Elbow B Elbow 1.2 10.0 83 >50  A Elbow    8.1  9.0          EMG   Side Muscle Ins Act Fibs Psw  Fasc Number Recrt Dur Dur. Amp Amp. Poly Poly. Comment  Right 1stDorInt Nml Nml Nml Nml Nml Mod-R Few 1+ Few 1+ Nml Nml N/A  Right Ext Indicis Nml Nml Nml Nml Nml Nml Nml Nml Nml Nml Nml Nml N/A  Right ABD Dig Min Nml Nml Nml Nml Nml Nml Nml Nml Nml Nml Nml Nml N/A  Right FlexCarpiUln Nml Nml Nml Nml Nml Nml Nml Nml Nml Nml Nml Nml N/A  Right PronatorTeres Nml Nml Nml Nml Nml Nml Nml Nml Nml Nml Nml Nml N/A  Right Biceps Nml Nml Nml Nml Nml Nml Nml Nml Nml Nml Nml Nml N/A  Right Triceps Nml Nml Nml Nml Nml Nml Nml Nml Nml Nml Nml Nml N/A  Right Deltoid Nml Nml Nml Nml Nml Mod-R Few 1+ Few 1+ Nml Nml N/A  Right Infraspinatus Nml Nml Nml Nml Nml Mod-R Few 1+ Few 1+ Nml Nml N/A      Waveforms:

## 2016-03-22 ENCOUNTER — Other Ambulatory Visit: Payer: Self-pay | Admitting: Medical

## 2016-04-26 ENCOUNTER — Other Ambulatory Visit: Payer: Self-pay | Admitting: Medical

## 2016-05-21 ENCOUNTER — Encounter: Payer: Self-pay | Admitting: Neurology

## 2016-05-21 ENCOUNTER — Ambulatory Visit (INDEPENDENT_AMBULATORY_CARE_PROVIDER_SITE_OTHER): Payer: 59 | Admitting: Neurology

## 2016-05-21 VITALS — BP 120/80 | HR 89 | Ht 71.5 in | Wt 217.4 lb

## 2016-05-21 DIAGNOSIS — Z981 Arthrodesis status: Secondary | ICD-10-CM | POA: Diagnosis not present

## 2016-05-21 DIAGNOSIS — M5416 Radiculopathy, lumbar region: Secondary | ICD-10-CM

## 2016-05-21 DIAGNOSIS — G5621 Lesion of ulnar nerve, right upper limb: Secondary | ICD-10-CM | POA: Insufficient documentation

## 2016-05-21 DIAGNOSIS — R292 Abnormal reflex: Secondary | ICD-10-CM

## 2016-05-21 MED ORDER — DIAZEPAM 5 MG PO TABS: ORAL_TABLET | ORAL | Status: DC

## 2016-05-21 MED ORDER — GABAPENTIN 300 MG PO CAPS: ORAL_CAPSULE | ORAL | Status: DC

## 2016-05-21 MED ORDER — CYCLOBENZAPRINE HCL 5 MG PO TABS: 5.0000 mg | ORAL_TABLET | Freq: Two times a day (BID) | ORAL | Status: DC | PRN

## 2016-05-21 NOTE — Patient Instructions (Addendum)
1.  Gabapentin 300 mg tablets     Morning       Afternoon        Evening   Week 1 1 tab                                 2 tab               Week 2 2 tab                   2 tab              2.  Continue flexeril 5mg  twice daily for low back pain      3.  MRI lumbar spine wo contrast  Return to clinic in 3 months

## 2016-05-21 NOTE — Progress Notes (Signed)
Follow-up Visit   Date: 05/21/2016    Elijah QuintCharles J Propst MRN: [id number] DOB: 1976/11/09   Interim History: Elijah Pennington is a 40 y.o. right-handed Caucasian male with s/p C6-7 ACDF (2016) returning to the clinic for follow-up of right arm pain.  The patient was accompanied to the clinic by self.  History of present illness: In early 2014, he started having numbness over the right upper arm, forearm, and last two fingers. Imaging shows disc hernation at C6-7 and he underwent ACDF at this level which alleviated his symptoms for at least 1.5 years. He feels that since the surgery, his pinky and ring finger are more clumsy and weak. Around the summer 2016, he began noticing recurrence of the same symptoms. He occasionally has left sided neck pain, but nothing that radiates down his arm. 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 He is seeing Dr. Yevette Edwardsumonski for his chronic neck discomfort. He underwent ESI in October 2016 by Dr. Modesto CharonWong which helped for about a month.   In early March, he had flu-like symptoms and was vomiting a lot. He reports vomiting violently and following this, he developed immediate right sided neck pain and tingling radiating down his right arm and into his thumb. He saw his PCP who started him on gabapentin 100mg  twice daily and flexeril 10mg  twice daily and has noticed 40% improvement. He does not notice any benefit with NSAIDs or tramadol. He has previously done neck PT in January 2017 and felt traction helped the most.   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   UPDATE 05/21/2016:  He has started to use a elbow pad for his right ulnar neuropathy and has noticed improved paresthesias, but continues to feel like it is clumsy.  He has been doing his home exercises and using home exercises.  Symptoms have not worsened at all. He has been complaining of severe low back pain, which is achy and sometimes shooting and  radiates to his right hip and legs.  He does not have weakness, but pain limits how he walks.  He has tried TENs unit and icy hot which helped briefly.  He has noticed that prolonged standing exacerbates pain.   Medications:  Current Outpatient Prescriptions on File Prior to Visit  Medication Sig Dispense Refill  . cyclobenzaprine (FLEXERIL) 5 MG tablet Take 1 tablet (5 mg total) by mouth 2 (two) times daily as needed for muscle spasms. 60 tablet 5  . gabapentin (NEURONTIN) 300 MG capsule Take 1 tablet at bedtime for a week, then increase to 1 tablet twice daily 60 capsule 5  . Omeprazole 20 MG TBEC TAKE 1 TABLET(20 MG) BY MOUTH TWICE DAILY 56 tablet 0   No current facility-administered medications on file prior to visit.    Allergies:  Allergies  Allergen Reactions  . Penicillins     Review of Systems:  CONSTITUTIONAL: No fevers, chills, night sweats, or weight loss.  EYES: No visual changes or eye pain ENT: No hearing changes.  No history of nose bleeds.   RESPIRATORY: No cough, wheezing and shortness of breath.   CARDIOVASCULAR: Negative for chest pain, and palpitations.   GI: Negative for abdominal discomfort, blood in stools or black stools.  No recent change in bowel habits.   GU:  No history of incontinence.   MUSCLOSKELETAL: No history of joint pain or swelling.  No myalgias.   SKIN: Negative for lesions, rash, and itching.  ENDOCRINE: Negative for cold or heat intolerance, polydipsia or goiter.   PSYCH:  No depression or anxiety symptoms.   NEURO: As Above.   Vital Signs:  BP 120/80 mmHg  Pulse 89  Ht 5' 11.5" (1.816 m)  Wt 217 lb 7 oz (98.629 kg)  BMI 29.91 kg/m2  SpO2 96%  Neurological Exam: MENTAL STATUS including orientation to time, place, person, recent and remote memory, attention span and concentration, language, and fund of knowledge is normal.  Speech is not dysarthric.  CRANIAL NERVES:  Face is symmetric.   MOTOR:  Motor strength is 5/5 in all  extremities. No pronator drift.  Tone is normal.    MSRs:  Right                                                                 Left brachioradialis 3+  brachioradialis 3+  biceps 3+  biceps 3+  triceps 2+  triceps 2+  patellar 3+  patellar 3+  ankle jerk 2+  ankle jerk 2+  Hoffman no  Hoffman no  plantar response down  plantar response down   SENSORY:  Intact to vibration, temperature, and pin prick throughout (improved).  COORDINATION/GAIT:   Gait narrow based and stable.   Data: NCS/EMG of the right upper extremity 02/28/2016: 1. Right ulnar neuropathy with slowing across the elbow, mild in degree electrically. 2. There is also evidence of the residuals of a chronic C5 radiculopathy, very mild in degree electrically.  MRI cervical spine 07/25/2015 [facility]: 1. Uncovertebral disease and facet arthropathy at C5-6 causing mild to moderate right foraminal narrowing. The central canal and left foramen are open. 2. Uncovertebral disease on the right at C3-4 causing mild foraminal narrowing. The central canal and left foramen are widely patent. 3. Status post C6-7 discectomy and fusion. The central canal and foramina are widely patent.    IMPRESSION/PLAN: 1. Chronic low back pain, radiating into the R> L leg  - MRI lumbar spine wo contrast to look for disc protrusion or structural disease ?canal stenosis, due to mild hyperreflexia in the legs  - Increase gabapentin to 600mg  twice daily  - Continue flexeril 5mg  twice daily  2.  Right cervical radiculopathy s/p C6-7 ACDF (2014), continues to have intermittent radicular pain, but overall improved with home exercises and traction  3. Right ulnar neuropathy across the elbow, stable  - Continue soft elbow pad and avoid hyperflexion of the elbow  Return to clinic in 3 months  The duration of this appointment visit was 25 minutes of face-to-face time with the patient.  Greater than 50% of this time was spent  in counseling, explanation of diagnosis, planning of further management, and coordination of care.   Thank you for allowing me to participate in patient's care.  If I can answer any additional questions, I would be pleased to do so.     Sincerely,    Lillyanna Glandon K. [name], DO

## 2016-05-31 ENCOUNTER — Ambulatory Visit
Admission: RE | Admit: 2016-05-31 | Discharge: 2016-05-31 | Disposition: A | Discharge status: Home / Self Care | Payer: 59 | Source: Ambulatory Visit | Attending: Neurology | Admitting: Neurology

## 2016-05-31 DIAGNOSIS — Z981 Arthrodesis status: Secondary | ICD-10-CM

## 2016-05-31 DIAGNOSIS — G5621 Lesion of ulnar nerve, right upper limb: Secondary | ICD-10-CM

## 2016-05-31 DIAGNOSIS — R292 Abnormal reflex: Secondary | ICD-10-CM

## 2016-05-31 DIAGNOSIS — M5416 Radiculopathy, lumbar region: Secondary | ICD-10-CM

## 2016-07-10 ENCOUNTER — Telehealth: Payer: Self-pay | Admitting: Neurology

## 2016-07-10 NOTE — Telephone Encounter (Signed)
Patient needs the results of the MRI please call [phone]

## 2016-07-11 ENCOUNTER — Encounter: Payer: Self-pay | Admitting: *Deleted

## 2016-07-11 NOTE — Telephone Encounter (Signed)
Please advise 

## 2016-07-11 NOTE — Telephone Encounter (Signed)
Attempted to contact patient.  Got voicemail but his mailbox is full.  Sent a message via My Chart giving the results.

## 2016-07-11 NOTE — Telephone Encounter (Signed)
MRI lumbar spine looks good, only mild age-related degenerative changes, but there is no evidence of nerve impingement.   Please inform patient that imaging report has been released to him on MyChart.  We tried calling him in July and left a message (see result note).  Elijah Pennington K. [name], DO

## 2016-08-10 ENCOUNTER — Ambulatory Visit (INDEPENDENT_AMBULATORY_CARE_PROVIDER_SITE_OTHER): Payer: 59 | Admitting: Internal Medicine

## 2016-08-10 ENCOUNTER — Encounter: Payer: Self-pay | Admitting: Internal Medicine

## 2016-08-10 VITALS — BP 108/72 | HR 86 | Temp 98.2°F | Resp 14 | Ht 71.5 in | Wt 225.1 lb

## 2016-08-10 DIAGNOSIS — Z23 Encounter for immunization: Secondary | ICD-10-CM

## 2016-08-10 DIAGNOSIS — R1012 Left upper quadrant pain: Secondary | ICD-10-CM

## 2016-08-10 DIAGNOSIS — Z0000 Encounter for general adult medical examination without abnormal findings: Secondary | ICD-10-CM

## 2016-08-10 DIAGNOSIS — M25571 Pain in right ankle and joints of right foot: Secondary | ICD-10-CM

## 2016-08-10 LAB — LIPID PANEL
CHOLESTEROL: 192 mg/dL (ref 0–200)
HDL: 38.1 mg/dL — AB (ref >39.00)
NonHDL: 154.26
Total CHOL/HDL Ratio: 5
Triglycerides: 243 mg/dL — ABNORMAL HIGH (ref 0.0–149.0)
VLDL: 48.6 mg/dL — AB (ref 0.0–40.0)

## 2016-08-10 LAB — LDL CHOLESTEROL, DIRECT: Direct LDL: 126 mg/dL

## 2016-08-10 LAB — TSH: TSH: 3.17 u[IU]/mL (ref 0.35–4.50)

## 2016-08-10 MED ORDER — PANTOPRAZOLE SODIUM 40 MG PO TBEC: 40.0000 mg | DELAYED_RELEASE_TABLET | Freq: Every day | ORAL | 6 refills | Status: DC

## 2016-08-10 NOTE — Progress Notes (Signed)
Subjective:    Patient ID: Elijah Pennington, male    DOB: 09/07/1976, 40 y.o.   MRN: [id number]  DOS:  08/10/2016 Type of visit - description : CPX Interval history: Has a few concerns, see below    Review of Systems 1.5 years history of occasional left upper quadrant abdominal pain, usually has an  episode every 2 weeks, when he has had episodic pain is steady, mild, no radiation,  last 12-24 hours. Denies fever, chills, weight loss, blood in the stools. No heartburn but omeprazole seemed to help pain to some extent  Also complaining of left big toe pain and right ankle pain. Ortho  Referral?  Constitutional: No fever. No chills. No unexplained wt changes. No unusual sweats  HEENT: No dental problems, no ear discharge, no facial swelling, no voice changes. No eye discharge, no eye  redness , no  intolerance to light   Respiratory: No wheezing , no  difficulty breathing. No cough , no mucus production  Cardiovascular: No CP, no leg swelling , no  Palpitations  GI: no nausea, no vomiting, no diarrhea  No blood in the stools. No dysphagia, no odynophagia. No actual heartburn    Endocrine: No polyphagia, no polyuria , no polydipsia  GU: No dysuria, gross hematuria, difficulty urinating. No urinary urgency, no frequency.  Musculoskeletal: see above Skin: No change in the color of the skin, palor , no  Rash  Allergic, immunologic: No environmental allergies , no  food allergies  Neurological: No dizziness no  syncope. No headaches. No diplopia, no slurred, no slurred speech, no motor deficits, no facial  Numbness  Hematological: No enlarged lymph nodes, no easy bruising , no unusual bleedings  Psychiatry: No suicidal ideas, no hallucinations, no beavior problems, no confusion.  No unusual/severe anxiety, no depression    Past Medical History:  Diagnosis Date  . Asthma   . H/O intrinsic asthma   . H/O: gout   . Heart murmur    h/o  . Radiculopathy of cervical region  2014   Secondary to new disc protrusion, s/p C6-7 ACDF 08/04/2013    Past Surgical History:  Procedure Laterality Date  . Anterior cervical discectomy and fusion  08/04/2013    Social History   Social History  . Marital status: Single    Spouse name: N/A  . Number of children: N/A  . Years of education: N/A   Occupational History  . 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 Smoking status: Former Games developer  . Smokeless tobacco: Never Used     Comment: Vapes  . Alcohol use 0.0 oz/week     Comment: 8-10 drinks  . Drug use: No  . Sexual activity: Not on file   Other Topics Concern  . Not on file   Social History Narrative   Lives with wife and children.  Has 3 children.     Works at the [employer].  Education: college.     Family History  Problem Relation Age of Onset  . GI Disease Mother   . Glaucoma Father   . Ulcerative colitis Brother   . Healthy Son     x 3  . Breast cancer Maternal Grandmother   . CAD Neg Hx   . Colon cancer Neg Hx   . Prostate cancer Neg Hx   . Diabetes Neg Hx        Medication List       Accurate as  of 08/10/16 11:59 PM. Always use your most recent med list.          cyclobenzaprine 5 MG tablet Commonly known as:  FLEXERIL Take 1 tablet (5 mg total) by mouth 2 (two) times daily as needed for muscle spasms.   gabapentin 300 MG capsule Commonly known as:  NEURONTIN Take 2 tablet at bedtime for a week, then increase to 2 tablet twice daily   pantoprazole 40 MG tablet Commonly known as:  PROTONIX Take 1 tablet (40 mg total) by mouth daily.          Objective:   Physical Exam BP 108/72 (BP Location: Left Arm, Patient Position: Sitting, Cuff Size: Normal)   Pulse 86   Temp 98.2 F (36.8 C) (Oral)   Resp 14   Ht 5' 11.5" (1.816 m)   Wt 225 lb 2 oz (102.1 kg)   SpO2 98%   BMI 30.96 kg/m   General:   Well developed, well nourished . NAD.  Neck: No  thyromegaly  HEENT:  Normocephalic . Face  symmetric, atraumatic Lungs:  CTA B Normal respiratory effort, no intercostal retractions, no accessory muscle use. Heart: RRR,  no murmur.  No pretibial edema bilaterally  Abdomen:  Not distended, soft, non-tender. No rebound or rigidity.   Skin: Exposed areas without rash. Not pale. Not jaundice Neurologic:  alert & oriented X3.  Speech normal, gait appropriate for age and unassisted Strength symmetric and appropriate for age.  Psych: Cognition and judgment appear intact.  Cooperative with normal attention span and concentration.  Behavior appropriate. No anxious or depressed appearing.    Assessment & Plan:   Asessment Cervical surgery s/p C6-7 ACDF (2014  Continue with intermittent symptoms Chronic low back pain, sees Dr. [name] H/o  Asthma, rare sx  H/o Gout H/o Heart murmur  PLAN Here for CPX, in general doing well. Pain right ankle, left great toe: Refer to [facility] abdominal pain: Recommend full dose of PPIs, Rx pantoprazole daily for 2 months then as needed. Check ultrasound. RTC 1 year

## 2016-08-10 NOTE — Patient Instructions (Signed)
GO TO THE LAB : Get the blood work     GO TO THE FRONT DESK Schedule your next appointment for a  physical exam in one year, fasting   Pantoprazole daily for 2 months, before breakfast. Then as needed

## 2016-08-10 NOTE — Progress Notes (Signed)
Pre visit review using our clinic review tool, if applicable. No additional management support is needed unless otherwise documented below in the visit note. 

## 2016-08-10 NOTE — Assessment & Plan Note (Addendum)
Td 07-2016 Flu shot today Labs from 01-2016 reviewed, will check a FLP and TSH Diet and exercise discussed Nicotine addiction: Former smoker, now vapes, rec complete avoidance.

## 2016-08-12 DIAGNOSIS — Z09 Encounter for follow-up examination after completed treatment for conditions other than malignant neoplasm: Secondary | ICD-10-CM | POA: Insufficient documentation

## 2016-08-12 NOTE — Assessment & Plan Note (Signed)
Here for CPX, in general doing well. Pain right ankle, left great toe: Refer to [facility] abdominal pain: Recommend full dose of PPIs, Rx pantoprazole daily for 2 months then as needed. Check ultrasound. RTC 1 year

## 2016-08-13 ENCOUNTER — Ambulatory Visit (HOSPITAL_BASED_OUTPATIENT_CLINIC_OR_DEPARTMENT_OTHER)
Admission: RE | Admit: 2016-08-13 | Discharge: 2016-08-13 | Disposition: A | Discharge status: Home / Self Care | Payer: 59 | Source: Ambulatory Visit | Attending: Internal Medicine | Admitting: Internal Medicine

## 2016-08-13 DIAGNOSIS — R1012 Left upper quadrant pain: Secondary | ICD-10-CM | POA: Insufficient documentation

## 2016-09-03 ENCOUNTER — Ambulatory Visit (INDEPENDENT_AMBULATORY_CARE_PROVIDER_SITE_OTHER): Payer: 59 | Admitting: Neurology

## 2016-09-03 ENCOUNTER — Encounter: Payer: Self-pay | Admitting: Neurology

## 2016-09-03 VITALS — BP 128/60 | HR 86 | Ht 71.0 in | Wt 231.0 lb

## 2016-09-03 DIAGNOSIS — M5412 Radiculopathy, cervical region: Secondary | ICD-10-CM | POA: Diagnosis not present

## 2016-09-03 DIAGNOSIS — G5621 Lesion of ulnar nerve, right upper limb: Secondary | ICD-10-CM | POA: Diagnosis not present

## 2016-09-03 DIAGNOSIS — G8929 Other chronic pain: Secondary | ICD-10-CM

## 2016-09-03 DIAGNOSIS — Z981 Arthrodesis status: Secondary | ICD-10-CM

## 2016-09-03 DIAGNOSIS — M545 Low back pain: Secondary | ICD-10-CM | POA: Diagnosis not present

## 2016-09-03 MED ORDER — CYCLOBENZAPRINE HCL 5 MG PO TABS
5.0000 mg | ORAL_TABLET | Freq: Two times a day (BID) | ORAL | 3 refills | Status: DC | PRN
Start: 2016-09-03 — End: 2017-05-20

## 2016-09-03 MED ORDER — GABAPENTIN 600 MG PO TABS: 600.0000 mg | ORAL_TABLET | Freq: Three times a day (TID) | ORAL | 3 refills | Status: DC

## 2016-09-03 NOTE — Patient Instructions (Addendum)
Continue your medications as you are taking them If you feel that your pain is improving, reduce gabapentin by 300mg /week and stay on the dose that controls your pain the best Follow-up with Dr. Drue NovelPaz for right hip pain  Return to clinic as needed

## 2016-09-03 NOTE — Progress Notes (Signed)
Follow-up Visit   Date: 09/03/16    Elijah Pennington MRN: [id number] DOB: 11/28/1975   Interim History: Elijah Pennington is a 40 y.o. right-handed Caucasian male with s/p C6-7 ACDF (2016) returning to the clinic for follow-up of right arm pain.  The patient was accompanied to the clinic by self.  History of present illness: In early 2014, he started having numbness over the right upper arm, forearm, and last two fingers. Imaging shows disc hernation at C6-7 and he underwent ACDF at this level which alleviated his symptoms for at least 1.5 years. He feels that since the surgery, his pinky and ring finger are more clumsy and weak. Around the summer 2016, he began noticing recurrence of the same symptoms. He occasionally has left sided neck pain, but nothing that radiates down his arm. 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 He is seeing Dr. Yevette Edwards for his chronic neck discomfort. He underwent ESI in October 2016 by Dr. Modesto Charon which helped for about a month.   In early March, he had flu-like symptoms and was vomiting a lot. He reports vomiting violently and following this, he developed immediate right sided neck pain and tingling radiating down his right arm and into his thumb. He saw his PCP who started him on gabapentin 100mg  twice daily and flexeril 10mg  twice daily and has noticed 40% improvement. He does not notice any benefit with NSAIDs or tramadol. He has previously done neck PT in January 2017 and felt traction helped the most.   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   UPDATE 05/21/2016:  He has started to use a elbow pad for his right ulnar neuropathy and has noticed improved paresthesias, but continues to feel like it is clumsy.  He has been doing his home exercises and using home exercises.  Symptoms have not worsened at all. He has been complaining of severe low back pain, which is achy and sometimes shooting and  radiates to his right hip and legs.  He does not have weakness, but pain limits how he walks.  He has tried TENs unit and icy hot which helped briefly.  He has noticed that prolonged standing exacerbates pain.   UPDATE 09/03/2016:  He has noticed any new improve with his back and hip pain. MRI lumbar spine did not show evidence of nerve impingement, mild degenerates changes were noted.  He continues to take flexeril 5mg  BID and neurontin 600mg  BID for both his neck and back discomfort, which provides adequate relief. Right hand has intermittent tingling over the 5th digit, but this has improved since being more cautious about overflexing at the elbow.  Medications:  Current Outpatient Prescriptions on File Prior to Visit  Medication Sig Dispense Refill  . pantoprazole (PROTONIX) 40 MG tablet Take 1 tablet (40 mg total) by mouth daily. (Patient not taking: Reported on 09/03/2016) 30 tablet 6   No current facility-administered medications on file prior to visit.     Allergies:  Allergies  Allergen Reactions  . Penicillins     Review of Systems:  CONSTITUTIONAL: No fevers, chills, night sweats, or weight loss.  EYES: No visual changes or eye pain ENT: No hearing changes.  No history of nose bleeds.   RESPIRATORY: No cough, wheezing and shortness of breath.   CARDIOVASCULAR: Negative for chest pain, and palpitations.   GI: Negative for abdominal discomfort, blood in stools or black stools.  No recent change in  bowel habits.   GU:  No history of incontinence.   MUSCLOSKELETAL: No history of joint pain or swelling.  No myalgias.   SKIN: Negative for lesions, rash, and itching.   ENDOCRINE: Negative for cold or heat intolerance, polydipsia or goiter.   PSYCH:  No depression or anxiety symptoms.   NEURO: As Above.   Vital Signs:  BP 128/60   Pulse 86   Ht 5\' 11"  (1.803 m)   Wt 231 lb (104.8 kg)   BMI 32.22 kg/m   Neurological Exam: MENTAL STATUS including orientation to time, place,  person, recent and remote memory, attention span and concentration, language, and fund of knowledge is normal.  Speech is not dysarthric.  CRANIAL NERVES:  Face is symmetric.   MOTOR:  Motor strength is 5/5 in all extremities. No pronator drift.  Tone is normal.    MSRs:  Right                                                                 Left brachioradialis 3+  brachioradialis 3+  biceps 3+  biceps 3+  triceps 2+  triceps 2+  patellar 3+  patellar 3+  ankle jerk 2+  ankle jerk 2+  Hoffman no  Hoffman no  plantar response down  plantar response down   SENSORY:  Intact to vibration, temperature, and pin prick throughout (improved).  COORDINATION/GAIT:   Gait narrow based and stable.   Data: NCS/EMG of the right upper extremity 02/28/2016: 1. Right ulnar neuropathy with slowing across the elbow, mild in degree electrically. 2. There is also evidence of the residuals of a chronic C5 radiculopathy, very mild in degree electrically.  [facility]: 1. Uncovertebral disease and facet arthropathy at C5-6 causing mild to moderate right foraminal narrowing. The central canal and left foramen are open. 2. Uncovertebral disease on the right at C3-4 causing mild foraminal narrowing. The central canal and left foramen are widely patent. 3. Status post C6-7 discectomy and fusion. The central canal and foramina are widely patent.  MRI lumbar spine 06/01/2016: Mild lumbar disc and facet degeneration without evidence of neural impingement.  IMPRESSION/PLAN: 1. Chronic low back pain  - MRI lumbar spine with mild lumbar disc and facet degeneration, no nerve impingement  - Continue gabapentin to 600mg  twice daily - refills provided  - Continue flexeril 5mg  twice daily - refills provided  2.  Right cervical radiculopathy s/p C6-7 ACDF (2014), continues to have intermittent radicular pain, but overall improved with home exercises and traction  3.  Right ulnar neuropathy across the elbow, stable  - Continue soft elbow pad and avoid hyperflexion of the elbow  4.  Right hip pain, follow up with PCP  Return to clinic as needed   The duration of this appointment visit was 20 minutes of face-to-face time with the patient.  Greater than 50% of this time was spent in counseling, explanation of diagnosis, planning of further management, and coordination of care.   Thank you for allowing me to participate in patient's care.  If I can answer any additional questions, I would be pleased to do so.     Sincerely,    Stephonie Wilcoxen K. [name], DO

## 2017-05-20 ENCOUNTER — Ambulatory Visit (HOSPITAL_BASED_OUTPATIENT_CLINIC_OR_DEPARTMENT_OTHER)
Admission: RE | Admit: 2017-05-20 | Discharge: 2017-05-20 | Disposition: A | Discharge status: Home / Self Care | Payer: 59 | Source: Ambulatory Visit | Attending: Internal Medicine | Admitting: Internal Medicine

## 2017-05-20 ENCOUNTER — Ambulatory Visit (INDEPENDENT_AMBULATORY_CARE_PROVIDER_SITE_OTHER): Payer: 59 | Admitting: Internal Medicine

## 2017-05-20 ENCOUNTER — Encounter: Payer: Self-pay | Admitting: Internal Medicine

## 2017-05-20 ENCOUNTER — Telehealth: Payer: Self-pay

## 2017-05-20 VITALS — BP 128/70 | HR 73 | Temp 98.3°F | Resp 14 | Ht 71.0 in | Wt 231.4 lb

## 2017-05-20 DIAGNOSIS — L989 Disorder of the skin and subcutaneous tissue, unspecified: Secondary | ICD-10-CM

## 2017-05-20 DIAGNOSIS — K219 Gastro-esophageal reflux disease without esophagitis: Secondary | ICD-10-CM | POA: Diagnosis not present

## 2017-05-20 DIAGNOSIS — J189 Pneumonia, unspecified organism: Secondary | ICD-10-CM | POA: Diagnosis not present

## 2017-05-20 DIAGNOSIS — Z720 Tobacco use: Secondary | ICD-10-CM | POA: Diagnosis not present

## 2017-05-20 MED ORDER — PANTOPRAZOLE SODIUM 40 MG PO TBEC: 40.0000 mg | DELAYED_RELEASE_TABLET | Freq: Every day | ORAL | 6 refills | Status: DC

## 2017-05-20 MED ORDER — ALBUTEROL SULFATE HFA 108 (90 BASE) MCG/ACT IN AERS: 2.0000 | INHALATION_SPRAY | Freq: Four times a day (QID) | RESPIRATORY_TRACT | 1 refills | Status: DC | PRN

## 2017-05-20 NOTE — Patient Instructions (Addendum)
   GO TO THE FRONT DESK Schedule your next appointment for a  Physical exam by 07-2017   STOP BY THE FIRST FLOOR:  get the XR

## 2017-05-20 NOTE — Assessment & Plan Note (Signed)
[facility]: Dx 12-2016 elsewhere, currently asx. We'll get a chest x-ray. Asthma: Pt was seen with bronchitis a couple of weeks ago elsewhere,rx albuterol, abx and steroids, he is better. On looking back, he had asthma as a child and he has wheezing whenever he has a URI. MD @ the UC somewhat concerned about COPD, that is possible but at this point he does not need any urgent workup. If he has more frequent wheezing will pursue PFTs etc. For now continue with albuterol as needed. Skin lesion: Refer to dermatology GERD: Refill PPIs, symptoms well-controlled Nicotine abuse: Patient was a heavy smoker up until around 2014, now vapes; rec cessation of vaping; risks discussed. Nicotine patch?. RTC 07-2017 CPX

## 2017-05-20 NOTE — Progress Notes (Signed)
Subjective:    Patient ID: Elijah Pennington, male    DOB: 30-Jul-1976, 41 y.o.   MRN: [id number]  DOS:  05/20/2017 Type of visit - description : acute Interval history: Went to urgent care 12-2016 w/ Respiratory symptoms, got a chest x-ray, DX pneumonia, Rx antibiotics, felt better in a matter of days.  Went to the same urgent care approximately 2 weeks ago, diagnosed with bronchitis, prescribed albuterol, antibiotics and steroids. He is feeling much better. He was recommended to come here as he was told that he possibly could have COPD.  Also has a skin lesion, left arm. Patient is somewhat concerned about it.  Review of Systems Currently with no fever chills. No chest pain or difficulty breathing. GERD well-controlled on PPIs. He admits to occasional sputum production but is clear.   Past Medical History:  Diagnosis Date  . Asthma   . H/O intrinsic asthma   . H/O: gout   . Heart murmur    h/o  . Radiculopathy of cervical region 2014   Secondary to new disc protrusion, s/p C6-7 ACDF 08/04/2013    Past Surgical History:  Procedure Laterality Date  . Anterior cervical discectomy and fusion  08/04/2013    Social History   Social History  . Marital status: Single    Spouse name: N/A  . Number of children: N/A  . Years of education: N/A   Occupational History  . college- works @ [employer]     Social History Main Topics  . Smoking status: Former Smoker    Quit date: 05/20/2013  . Smokeless tobacco: Never Used     Comment: 1 ppd, quit ~ 2014, now vapes  . Alcohol use 0.0 oz/week     Comment: 8-10 drinks  . Drug use: No  . Sexual activity: Not on file   Other Topics Concern  . Not on file   Social History Narrative   Lives with wife and children.  Has 3 children.     Works at the [employer].  Education: college.      Allergies as of 05/20/2017      Reactions   Penicillins       Medication List       Accurate as of 05/20/17  5:24 PM. Always use your  most recent med list.          albuterol 108 (90 Base) MCG/ACT inhaler Commonly known as:  VENTOLIN HFA Inhale 2 puffs into the lungs every 6 (six) hours as needed for wheezing or shortness of breath.   pantoprazole 40 MG tablet Commonly known as:  PROTONIX Take 1 tablet (40 mg total) by mouth daily.          Objective:   Physical Exam BP 128/70 (BP Location: Left Arm, Patient Position: Sitting, Cuff Size: Normal)   Pulse 73   Temp 98.3 F (36.8 C) (Oral)   Resp 14   Ht 5\' 11"  (1.803 m)   Wt 231 lb 6 oz (105 kg)   SpO2 98%   BMI 32.27 kg/m  General:   Well developed, well nourished . NAD.  HEENT:  Normocephalic . Face symmetric, atraumatic Lungs:  CTA B Normal respiratory effort, no intercostal retractions, no accessory muscle use. Heart: RRR,  no murmur.  No pretibial edema bilaterally  Skin: Left arm, inner aspect of the triceps has a 3 mm, slightly elevated, rough skin lesion. Neurologic:  alert & oriented X3.  Speech normal, gait appropriate for age and unassisted  Psych--  Cognition and judgment appear intact.  Cooperative with normal attention span and concentration.  Behavior appropriate. No anxious or depressed appearing.      Assessment & Plan:   Asessment Cervical surgery s/p C6-7 ACDF (2014)  Continue w/ intermittent sx Chronic low back pain, sees Dr. Allena Katz H/o  Asthma, rare sx  H/o Gout H/o Heart murmur  PLAN [facility] pneumonia: Dx 12-2016 elsewhere, currently asx. We'll get a chest x-ray. Asthma: Pt was seen with bronchitis a couple of weeks ago elsewhere,rx albuterol, abx and steroids, he is better. On looking back, he had asthma as a child and he has wheezing whenever he has a URI. MD @ the UC somewhat concerned about COPD, that is possible but at this point he does not need any urgent workup. If he has more frequent wheezing will pursue PFTs etc. For now continue with albuterol as needed. Skin lesion: Refer to dermatology GERD: Refill  PPIs, symptoms well-controlled Nicotine abuse: Patient was a heavy smoker up until around 2014, now vapes; rec cessation of vaping; risks discussed. Nicotine patch?. RTC 07-2017 CPX

## 2017-05-20 NOTE — Telephone Encounter (Signed)
[facility] at [phone]. ROI sent for scanning. Awaiting records.

## 2017-05-20 NOTE — Progress Notes (Signed)
Pre visit review using our clinic review tool, if applicable. No additional management support is needed unless otherwise documented below in the visit note. 

## 2017-05-27 NOTE — Telephone Encounter (Signed)
Received medical records, placed in PCP red folder for review.

## 2017-05-27 NOTE — Telephone Encounter (Signed)
Went to urgent care, DX bronchitis. Rx Promethazine DM, prednisone and Zithromax.

## 2017-07-18 ENCOUNTER — Ambulatory Visit (INDEPENDENT_AMBULATORY_CARE_PROVIDER_SITE_OTHER): Payer: 59 | Admitting: Internal Medicine

## 2017-07-18 ENCOUNTER — Encounter: Payer: Self-pay | Admitting: Internal Medicine

## 2017-07-18 DIAGNOSIS — Z09 Encounter for follow-up examination after completed treatment for conditions other than malignant neoplasm: Secondary | ICD-10-CM | POA: Diagnosis not present

## 2017-07-18 MED ORDER — PREDNISONE 10 MG PO TABS: ORAL_TABLET | ORAL | 0 refills | Status: DC

## 2017-07-18 NOTE — Progress Notes (Signed)
Pre visit review using our clinic review tool, if applicable. No additional management support is needed unless otherwise documented below in the visit note. 

## 2017-07-18 NOTE — Patient Instructions (Signed)
Take prednisone as prescribed  You can also use Tylenol  500 mg OTC 2 tabs a day every 8 hours as needed for pain  Stretch the area.  you can find very useful videos regards stretching your hip and  back at:  [facility]   [email or web address]  Call if not improving for a referral

## 2017-07-18 NOTE — Assessment & Plan Note (Signed)
Hip pain:  Pain is located around the hip area, tendinitis?. I recommend anti-inflammatory treatment, previously meloxicam or similar medications did not help much. Will recommend a round of prednisone, Tylenol, stretching. If not better will call for a sports medicine referral.

## 2017-07-18 NOTE — Progress Notes (Signed)
Subjective:    Patient ID: Elijah Pennington, male    DOB: 1976/03/15, 41 y.o.   MRN: [id number]  DOS:  07/18/2017 Type of visit - description : Acute visit Interval history: Sx started ~ 1 year ago, worse x the last 1 month: Pain is located at the right lower back and around the hip. Also when he gets up. No radiation, no lower extremity paresthesias. Pain is described as burning. He mentioned these complaints to his neurologist last year, had a spine MRI : Mild DJD changes   Review of Systems No abdominal pain  Past Medical History:  Diagnosis Date  . Asthma   . H/O intrinsic asthma   . H/O: gout   . Heart murmur    h/o  . Radiculopathy of cervical region 2014   Secondary to new disc protrusion, s/p C6-7 ACDF 08/04/2013    Past Surgical History:  Procedure Laterality Date  . Anterior cervical discectomy and fusion  08/04/2013    Social History   Social History  . Marital status: Single    Spouse name: N/A  . Number of children: N/A  . Years of education: N/A   Occupational History  . 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 Smoking status: Former Smoker    Quit date: 05/20/2013  . Smokeless tobacco: Never Used     Comment: 1 ppd, quit ~ 2014, now vapes  . Alcohol use 0.0 oz/week     Comment: 8-10 drinks  . Drug use: No  . Sexual activity: Not on file   Other Topics Concern  . Not on file   Social History Narrative   Lives with wife and children.  Has 3 children.     Works at the [employer].  Education: college.      Allergies as of 07/18/2017      Reactions   Penicillins       Medication List       Accurate as of 07/18/17 11:44 AM. Always use your most recent med list.          albuterol 108 (90 Base) MCG/ACT inhaler Commonly known as:  VENTOLIN HFA Inhale 2 puffs into the lungs every 6 (six) hours as needed for wheezing or shortness of breath.   pantoprazole 40 MG tablet Commonly known as:  PROTONIX Take 1 tablet  (40 mg total) by mouth daily.          Objective:   Physical Exam  Musculoskeletal:       Legs:  BP 124/68 (BP Location: Left Arm, Patient Position: Sitting, Cuff Size: Normal)   Pulse 88   Temp 98.3 F (36.8 C) (Oral)   Resp 14   Ht 5\' 11"  (1.803 m)   Wt 231 lb (104.8 kg)   SpO2 98%   BMI 32.22 kg/m  General:   Well developed, well nourished . NAD.  HEENT:  Normocephalic . Face symmetric, atraumatic MSK: No TTP at the lumbar spine or sacroiliac area. See graphic. Hips: Normal rotation bilaterally, not tender at the trochanteric bursa area. Skin: Not pale. Not jaundice Neurologic:  alert & oriented X3.  Speech normal, gait appropriate for age and unassisted Psych--  Cognition and judgment appear intact.  Cooperative with normal attention span and concentration.  Behavior appropriate. No anxious or depressed appearing.      Assessment & Plan:   Asessment Cervical surgery s/p C6-7 ACDF (2014)  Continue w/ intermittent sx Chronic low  back pain, sees Dr. Allena Katz H/o  Asthma, rare sx  H/o Gout H/o Heart murmur  PLAN Hip pain:  Pain is located around the hip area, tendinitis?. I recommend anti-inflammatory treatment, previously meloxicam or similar medications did not help much. Will recommend a round of prednisone, Tylenol, stretching. If not better will call for a sports medicine referral.

## 2017-09-18 ENCOUNTER — Ambulatory Visit: Payer: 59 | Admitting: Internal Medicine

## 2017-09-20 ENCOUNTER — Ambulatory Visit (INDEPENDENT_AMBULATORY_CARE_PROVIDER_SITE_OTHER): Payer: 59 | Admitting: Internal Medicine

## 2017-09-20 ENCOUNTER — Encounter: Payer: Self-pay | Admitting: Internal Medicine

## 2017-09-20 VITALS — BP 132/78 | HR 68 | Temp 98.8°F | Resp 14 | Ht 71.0 in | Wt 241.2 lb

## 2017-09-20 DIAGNOSIS — Z23 Encounter for immunization: Secondary | ICD-10-CM

## 2017-09-20 DIAGNOSIS — M25571 Pain in right ankle and joints of right foot: Secondary | ICD-10-CM

## 2017-09-20 DIAGNOSIS — R945 Abnormal results of liver function studies: Secondary | ICD-10-CM | POA: Diagnosis not present

## 2017-09-20 DIAGNOSIS — Z114 Encounter for screening for human immunodeficiency virus [HIV]: Secondary | ICD-10-CM | POA: Diagnosis not present

## 2017-09-20 DIAGNOSIS — Z0000 Encounter for general adult medical examination without abnormal findings: Secondary | ICD-10-CM | POA: Diagnosis not present

## 2017-09-20 DIAGNOSIS — M25572 Pain in left ankle and joints of left foot: Secondary | ICD-10-CM | POA: Diagnosis not present

## 2017-09-20 DIAGNOSIS — R7989 Other specified abnormal findings of blood chemistry: Secondary | ICD-10-CM

## 2017-09-20 LAB — COMPREHENSIVE METABOLIC PANEL
ALT: 60 U/L — ABNORMAL HIGH (ref 0–53)
AST: 49 U/L — AB (ref 0–37)
Albumin: 4.5 g/dL (ref 3.5–5.2)
Alkaline Phosphatase: 60 U/L (ref 39–117)
BUN: 13 mg/dL (ref 6–23)
CHLORIDE: 102 meq/L (ref 96–112)
CO2: 28 meq/L (ref 19–32)
Calcium: 9.4 mg/dL (ref 8.4–10.5)
Creatinine, Ser: 0.93 mg/dL (ref 0.40–1.50)
GFR: 94.96 mL/min (ref >60.00)
GLUCOSE: 90 mg/dL (ref 70–99)
POTASSIUM: 3.9 meq/L (ref 3.5–5.1)
SODIUM: 137 meq/L (ref 135–145)
Total Bilirubin: 0.7 mg/dL (ref 0.2–1.2)
Total Protein: 7.5 g/dL (ref 6.0–8.3)

## 2017-09-20 LAB — CBC WITH DIFFERENTIAL/PLATELET
BASOS ABS: 0 10*3/uL (ref 0.0–0.1)
Basophils Relative: 0.7 % (ref 0.0–3.0)
EOS ABS: 0.3 10*3/uL (ref 0.0–0.7)
Eosinophils Relative: 4 % (ref 0.0–5.0)
HCT: 44.6 % (ref 39.0–52.0)
HEMOGLOBIN: 15.1 g/dL (ref 13.0–17.0)
Lymphocytes Relative: 30.9 % (ref 12.0–46.0)
Lymphs Abs: 2.2 10*3/uL (ref 0.7–4.0)
MCHC: 33.8 g/dL (ref 30.0–36.0)
MCV: 97 fl (ref 78.0–100.0)
MONO ABS: 0.4 10*3/uL (ref 0.1–1.0)
Monocytes Relative: 6.1 % (ref 3.0–12.0)
NEUTROS PCT: 58.3 % (ref 43.0–77.0)
Neutro Abs: 4.1 10*3/uL (ref 1.4–7.7)
Platelets: 209 10*3/uL (ref 150.0–400.0)
RBC: 4.6 Mil/uL (ref 4.22–5.81)
RDW: 13.9 % (ref 11.5–15.5)
WBC: 7.1 10*3/uL (ref 4.0–10.5)

## 2017-09-20 LAB — LIPID PANEL
CHOL/HDL RATIO: 5
CHOLESTEROL: 221 mg/dL — AB (ref 0–200)
HDL: 48.9 mg/dL (ref >39.00)
LDL CALC: 142 mg/dL — AB (ref 0–99)
NonHDL: 171.77
Triglycerides: 151 mg/dL — ABNORMAL HIGH (ref 0.0–149.0)
VLDL: 30.2 mg/dL (ref 0.0–40.0)

## 2017-09-20 LAB — TSH: TSH: 1.78 u[IU]/mL (ref 0.35–4.50)

## 2017-09-20 NOTE — Progress Notes (Signed)
Subjective:    Patient ID: Elijah Pennington, male    DOB: [birth year], 41 y.o.   MRN: [id number]  DOS:  09/20/2017 Type of visit - description : cpx Interval history: He does have some concerns, see below  Review of Systems This year had about 3 episodes of sore throat, has cough up tonsilliths (" tonsil stones ") now and then.  Also, has a number of on and off aches/pains including neck pain after he had neck surgery , lately having ankle pain on/off.  Wonders if seeing pain management is a good idea.  Other than above, a 14 point review of systems is negative       Past Medical History:  Diagnosis Date  . Asthma   . H/O intrinsic asthma   . H/O: gout   . Heart murmur    h/o  . Radiculopathy of cervical region 2014   Secondary to new disc protrusion, s/p C6-7 ACDF 08/04/2013    Past Surgical History:  Procedure Laterality Date  . Anterior cervical discectomy and fusion  08/04/2013    Social History   Social History  . Marital status: Married    Spouse name: N/A  . Number of children: 3  . Years of education: N/A   Occupational History  . graduate from college - works @ [employer]     Social History Main Topics  . Smoking status: Former Smoker    Quit date: 05/20/2013  . Smokeless tobacco: Never Used     Comment: 1 ppd, quit ~ 2014, now vapes  . Alcohol use 0.0 oz/week     Comment: 8-10 drinks  . Drug use: No  . Sexual activity: Not on file   Other Topics Concern  . Not on file   Social History Narrative   Lives with wife and children.  Has 3 children.     Went to [employer] in [place], currently not performing    Works at the [employer].  Education: college.     Family History  Problem Relation Age of Onset  . GI Disease Mother   . Glaucoma Father   . Ulcerative colitis Brother   . Healthy Son        x 3  . Breast cancer Maternal Grandmother   . CAD Neg Hx   . Colon cancer Neg Hx   . Prostate cancer Neg Hx   . Diabetes Neg Hx       Allergies as of 09/20/2017      Reactions   Penicillins       Medication List       Accurate as of 09/20/17 11:59 PM. Always use your most recent med list.          albuterol 108 (90 Base) MCG/ACT inhaler Commonly known as:  VENTOLIN HFA Inhale 2 puffs into the lungs every 6 (six) hours as needed for wheezing or shortness of breath.   cyclobenzaprine 5 MG tablet Commonly known as:  FLEXERIL Take 5 mg by mouth 2 (two) times daily as needed for muscle spasms.   pantoprazole 40 MG tablet Commonly known as:  PROTONIX Take 1 tablet (40 mg total) by mouth daily.          Objective:   Physical Exam BP 132/78 (BP Location: Left Arm, Patient Position: Sitting, Cuff Size: Small)   Pulse 68   Temp 98.8 F (37.1 C) (Oral)   Resp 14   Ht 5\' 11"  (1.803 m)   Wt 241  lb 4 oz (109.4 kg)   SpO2 97%   BMI 33.65 kg/m  General:   Well developed, well nourished . NAD.  Neck: No  thyromegaly  HEENT:  Normocephalic . Face symmetric, atraumatic Tonsils are small but cryptic, no white patches or tonsilloliths seen today Lungs:  CTA B Normal respiratory effort, no intercostal retractions, no accessory muscle use. Heart: RRR,  no murmur.  No pretibial edema bilaterally  Abdomen:  Not distended, soft, non-tender. No rebound or rigidity.   Skin: Exposed areas without rash. Not pale. Not jaundice Neurologic:  alert & oriented X3.  Speech normal, gait appropriate for age and unassisted Strength symmetric and appropriate for age.  Psych: Cognition and judgment appear intact.  Cooperative with normal attention span and concentration.  Behavior appropriate. No anxious or depressed appearing.     Assessment & Plan:   Asessment Cervical surgery s/p C6-7 ACDF (2014)  Continue w/ intermittent sx Chronic low back pain, saw  Dr. Allena Katz, neuro , had NCS, MRIs, rx gaba, flexeril prn GERD  H/o  Asthma, rare sx  H/o Gout H/o Heart murmur  PLAN Chronic low back pain: Stopped  gabapentin d/t perceived s/e, takes Flexeril as needed.  rec to continue stretching and stay active. Ankle pain: has on-off ankle pain; also has a variety of pain at different locations on-off. Will refer to sports meds.  Tonsiliths: discussed dx, reassured, exam today is wnl RTC 1 year

## 2017-09-20 NOTE — Patient Instructions (Addendum)
GO TO THE LAB : Get the blood work     GO TO THE FRONT DESK Schedule your next appointment   for a physical exam in 1 year 

## 2017-09-20 NOTE — Assessment & Plan Note (Addendum)
Td 07-2016;  Flu shot today -Labs: He is nonfasting.  Will check a CMP, FLP, CBC, TSH, HIV -Diet and exercise discussed

## 2017-09-20 NOTE — Progress Notes (Signed)
Pre visit review using our clinic review tool, if applicable. No additional management support is needed unless otherwise documented below in the visit note. 

## 2017-09-21 LAB — HIV ANTIBODY (ROUTINE TESTING W REFLEX): HIV: NONREACTIVE

## 2017-09-22 NOTE — Assessment & Plan Note (Signed)
Chronic low back pain: Stopped gabapentin d/t perceived s/e, takes Flexeril as needed.  rec to continue stretching and stay active. Ankle pain: has on-off ankle pain; also has a variety of pain at different locations on-off. Will refer to sports meds.  Tonsiliths: discussed dx, reassured, exam today is wnl RTC 1 year

## 2017-09-24 NOTE — Addendum Note (Signed)
Addended by: Conrad BurlingtonANTER, Danyell Shader D on: 09/24/2017 12:56 PM   Modules accepted: Orders

## 2017-09-30 ENCOUNTER — Ambulatory Visit (INDEPENDENT_AMBULATORY_CARE_PROVIDER_SITE_OTHER): Payer: 59 | Admitting: Family Medicine

## 2017-09-30 ENCOUNTER — Encounter: Payer: Self-pay | Admitting: Family Medicine

## 2017-09-30 DIAGNOSIS — M25572 Pain in left ankle and joints of left foot: Secondary | ICD-10-CM | POA: Diagnosis not present

## 2017-09-30 DIAGNOSIS — G8929 Other chronic pain: Secondary | ICD-10-CM

## 2017-09-30 DIAGNOSIS — M25571 Pain in right ankle and joints of right foot: Secondary | ICD-10-CM

## 2017-09-30 MED ORDER — NITROGLYCERIN 0.2 MG/HR TD PT24: MEDICATED_PATCH | TRANSDERMAL | 1 refills | Status: DC

## 2017-09-30 NOTE — Patient Instructions (Signed)
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 Do home exercises 3 sets of 10 once a day - be careful with the one where you go outwards though - may avoid this one. Icing 15 minutes at a time 3-4 times a day if needed. Aleve or ibuprofen if needed though I don't think this will make this heal faster at this point. Nitro patches 1/4th patch to affected area (front of right ankle) and change daily. If you're doing well with this after a week you can start using them on the inside of your left ankle also. Use custom orthotics that you have as often as possible. Consider physical therapy, increased nitro dosage if not improving as expected. The left ankle pain is due to posterior tibialis tendinopathy. Do the exercises for this as well. Follow up with me in 6 weeks for reevaluation.

## 2017-10-01 ENCOUNTER — Encounter: Payer: Self-pay | Admitting: Family Medicine

## 2017-10-01 DIAGNOSIS — M25572 Pain in left ankle and joints of left foot: Secondary | ICD-10-CM

## 2017-10-01 DIAGNOSIS — M25571 Pain in right ankle and joints of right foot: Secondary | ICD-10-CM | POA: Insufficient documentation

## 2017-10-01 NOTE — Assessment & Plan Note (Signed)
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  Stressed importance of arch supports for both.  Icing, aleve or ibuprofen if needed.  Nitro patches for tendinopathies (discussed risks of headache, skin irritation.  Consider physical therapy, increased nitro dosage if not improving.  F/u in 6 weeks.

## 2017-10-01 NOTE — Progress Notes (Signed)
PCP and consultation requested by: Wanda PlumpPaz, Jose E, MD  Subjective:   HPI: Patient is a 41 y.o. male here for bilateral ankle pain.  Patient reports he's had about 1 1/2 years of problems with both ankles. Pain mainly in right ankle - feels this anteriorly and laterally. He has prior history of fractured toe right foot as well. He saw [facility] and tried custom orthotics which helped. Describes pain as 6/10 but up to 9/10 and sharp, Worse by end of day. Does a lot fo standing and walking at work. Occasionally been icing, taking ibuprofen. No skin changes, numbness.  Past Medical History:  Diagnosis Date  . Asthma   . H/O intrinsic asthma   . H/O: gout   . Heart murmur    h/o  . Radiculopathy of cervical region 2014   Secondary to new disc protrusion, s/p C6-7 ACDF 08/04/2013    Current Outpatient Medications on File Prior to Visit  Medication Sig Dispense Refill  . albuterol (VENTOLIN HFA) 108 (90 Base) MCG/ACT inhaler Inhale 2 puffs into the lungs every 6 (six) hours as needed for wheezing or shortness of breath. 1 Inhaler 1  . cyclobenzaprine (FLEXERIL) 5 MG tablet Take 5 mg by mouth 2 (two) times daily as needed for muscle spasms.    . pantoprazole (PROTONIX) 40 MG tablet Take 1 tablet (40 mg total) by mouth daily. 30 tablet 6   No current facility-administered medications on file prior to visit.     Past Surgical History:  Procedure Laterality Date  . Anterior cervical discectomy and fusion  08/04/2013    Allergies  Allergen Reactions  . Penicillins     Social History   Socioeconomic History  . Marital status: Married    Spouse name: Not on file  . Number of children: 3  . Years of education: Not on file  . Highest education level: Not on file  Social Needs  . Financial resource strain: Not on file  . Food insecurity - worry: Not on file  . Food insecurity - inability: Not on file  . Transportation needs - medical: Not on file  . Transportation needs  - non-medical: Not on file  Occupational History  . Occupation: graduate from college - works @ [employer]   Tobacco Use  . Smoking status: Former Smoker    Last attempt to quit: 05/20/2013    Years since quitting: 4.3  . Smokeless tobacco: Never Used  . Tobacco comment: 1 ppd, quit ~ 2014, now vapes  Substance and Sexual Activity  . Alcohol use: Yes    Alcohol/week: 0.0 oz    Comment: 8-10 drinks  . Drug use: No  . Sexual activity: Not on file  Other Topics Concern  . Not on file  Social History Narrative   Lives with wife and children.  Has 3 children.     Went to [employer] in [place], currently not performing    Works at the [employer].  Education: college.    Family History  Problem Relation Age of Onset  . GI Disease Mother   . Glaucoma Father   . Ulcerative colitis Brother   . Healthy Son        x 3  . Breast cancer Maternal Grandmother   . CAD Neg Hx   . Colon cancer Neg Hx   . Prostate cancer Neg Hx   . Diabetes Neg Hx     BP 133/85   Pulse 91   Ht 6' (  1.829 m)   Wt 235 lb (106.6 kg)   BMI 31.87 kg/m   Review of Systems: See HPI above.     Objective:  Physical Exam:  Gen: NAD, comfortable in exam room  Right ankle: Mild overpronation. No gross deformity, swelling, ecchymoses FROM with 5/5 strength. TTP over extensor digitorum and sinus tarsi. Negative ant drawer and talar tilt.   Negative syndesmotic compression. Thompsons test negative. NV intact distally.  Left ankle: Mild overpronation. No gross deformity, swelling, ecchymoses FROM with mild pain internal rotation. TTP over post tibialis tendon.  No other tenderness. Negative ant drawer and talar tilt.   Negative syndesmotic compression. Thompsons test negative. NV intact distally.   Assessment & Plan:  1. 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  Stressed importance of arch supports for both.   Icing, aleve or ibuprofen if needed.  Nitro patches for tendinopathies (discussed risks of headache, skin irritation.  Consider physical therapy, increased nitro dosage if not improving.  F/u in 6 weeks.

## 2017-10-28 ENCOUNTER — Encounter: Payer: 59 | Admitting: Internal Medicine

## 2017-11-11 ENCOUNTER — Ambulatory Visit: Payer: 59 | Admitting: Family Medicine

## 2017-11-25 ENCOUNTER — Ambulatory Visit: Payer: 59 | Admitting: Family Medicine

## 2017-11-25 ENCOUNTER — Encounter: Payer: Self-pay | Admitting: Family Medicine

## 2017-11-25 DIAGNOSIS — M25571 Pain in right ankle and joints of right foot: Secondary | ICD-10-CM

## 2017-11-25 DIAGNOSIS — M25572 Pain in left ankle and joints of left foot: Secondary | ICD-10-CM | POA: Diagnosis not present

## 2017-11-25 DIAGNOSIS — G8929 Other chronic pain: Secondary | ICD-10-CM | POA: Diagnosis not present

## 2017-11-25 MED ORDER — DICLOFENAC SODIUM 75 MG PO TBEC: 75.0000 mg | DELAYED_RELEASE_TABLET | Freq: Two times a day (BID) | ORAL | 1 refills | Status: DC

## 2017-11-25 NOTE — Patient Instructions (Signed)
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 The left ankle pain is due to posterior tibialis tendinopathy. Continue home exercises 3 sets of 10 once a day. Icing 15 minutes at a time 3-4 times a day if needed. Voltaren 75mg  twice a day with food. Increase the nitro to 1/2 patch each ankle, change daily. Use custom orthotics that you have as often as possible. Consider physical therapy, lyrica, cymbalta if not improving. Follow up with me in 6 weeks for reevaluation.

## 2017-11-25 NOTE — Assessment & Plan Note (Signed)
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  All issues improved with arch supports, nitro patches, home exercises.  Encouraged to continue these but increase nitro to 1/2 patch.  Try voltaren twice a day with food.  Consider PT, lyrica, cymbalta.  F/u in 6 weeks.

## 2017-11-25 NOTE — Progress Notes (Signed)
PCP and consultation requested by: Wanda PlumpPaz, Jose E, MD  Subjective:   HPI: Patient is a 41 y.o. male here for bilateral ankle pain.  11/5: Patient reports he's had about 1 1/2 years of problems with both ankles. Pain mainly in right ankle - feels this anteriorly and laterally. He has prior history of fractured toe right foot as well. He saw [facility] and tried custom orthotics which helped. Describes pain as 6/10 but up to 9/10 and sharp, Worse by end of day. Does a lot fo standing and walking at work. Occasionally been icing, taking ibuprofen. No skin changes, numbness.  12/31: Patient reports he has improved some since last visit. Pain 6/10 both ankles, a stiffness - anterior right ankle, medial left ankle. Feels less stiff with nitro patches. Pain worse by end of day. He also has chronic problems with his back. He has tried ibuprofen, gabapentin, mobic, tramadol, aleve. Would like to try something that helps in the evenings when pain is the worst. No numbness, skin changes.  Past Medical History:  Diagnosis Date  . Asthma   . H/O intrinsic asthma   . H/O: gout   . Heart murmur    h/o  . Radiculopathy of cervical region 2014   Secondary to new disc protrusion, s/p C6-7 ACDF 08/04/2013    Current Outpatient Medications on File Prior to Visit  Medication Sig Dispense Refill  . albuterol (VENTOLIN HFA) 108 (90 Base) MCG/ACT inhaler Inhale 2 puffs into the lungs every 6 (six) hours as needed for wheezing or shortness of breath. 1 Inhaler 1  . cyclobenzaprine (FLEXERIL) 5 MG tablet Take 5 mg by mouth 2 (two) times daily as needed for muscle spasms.    . nitroGLYCERIN (NITRODUR - DOSED IN MG/24 HR) 0.2 mg/hr patch Apply 1/4th patch to affected ankle, change daily 30 patch 1  . pantoprazole (PROTONIX) 40 MG tablet Take 1 tablet (40 mg total) by mouth daily. 30 tablet 6   No current facility-administered medications on file prior to visit.     Past Surgical History:   Procedure Laterality Date  . Anterior cervical discectomy and fusion  08/04/2013    Allergies  Allergen Reactions  . Penicillins     Social History   Socioeconomic History  . Marital status: Married    Spouse name: Not on file  . Number of children: 3  . Years of education: Not on file  . Highest education level: Not on file  Social Needs  . Financial resource strain: Not on file  . Food insecurity - worry: Not on file  . Food insecurity - inability: Not on file  . Transportation needs - medical: Not on file  . Transportation needs - non-medical: Not on file  Occupational History  . Occupation: graduate from college - works @ [employer]   Tobacco Use  . Smoking status: Former Smoker    Last attempt to quit: 05/20/2013    Years since quitting: 4.5  . Smokeless tobacco: Never Used  . Tobacco comment: 1 ppd, quit ~ 2014, now vapes  Substance and Sexual Activity  . Alcohol use: Yes    Alcohol/week: 0.0 oz    Comment: 8-10 drinks  . Drug use: No  . Sexual activity: Not on file  Other Topics Concern  . Not on file  Social History Narrative   Lives with wife and children.  Has 3 children.     [place]ent [place] [employer] in [place], [place]  Works at the [employer].  Education: college.    Family History  Problem Relation Age of Onset  . GI Disease Mother   . Glaucoma Father   . Ulcerative colitis Brother   . Healthy Son        x 3  . Breast cancer Maternal Grandmother   . CAD Neg Hx   . Colon cancer Neg Hx   . Prostate cancer Neg Hx   . Diabetes Neg Hx     BP 107/75   Pulse 69   Ht 6' (1.829 m)   Wt 220 lb (99.8 kg)   BMI 29.84 kg/m   Review of Systems: See HPI above.     Objective:  Physical Exam:  Gen: NAD, comfortable in exam room.  Right ankle: Mild overpronation. No gross deformity, swelling, ecchymoses FROM with 5/5 strength each direction. TTP minimally over sinus tarsi. Negative ant drawer and talar tilt.   Negative  syndesmotic compression. Thompsons test negative. NV intact distally.  Left ankle: Mild overpronation. No deformity, swelling, bruising. FROM without pain. TTP minimally over posterior tibialis tendon.  No other tenderness. Negative ant drawer and talar tilt.   Negative syndesmotic compression. Thompsons test negative. NV intact distally.   Assessment & Plan:  1. 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  All issues improved with arch supports, nitro patches, home exercises.  Encouraged to continue these but increase nitro to 1/2 patch.  Try voltaren twice a day with food.  Consider PT, lyrica, cymbalta.  F/u in 6 weeks.

## 2017-12-02 ENCOUNTER — Ambulatory Visit: Payer: 59 | Admitting: Family Medicine

## 2017-12-02 ENCOUNTER — Ambulatory Visit (HOSPITAL_BASED_OUTPATIENT_CLINIC_OR_DEPARTMENT_OTHER)
Admission: RE | Admit: 2017-12-02 | Discharge: 2017-12-02 | Disposition: A | Discharge status: Home / Self Care | Payer: 59 | Source: Ambulatory Visit | Attending: Family Medicine | Admitting: Family Medicine

## 2017-12-02 ENCOUNTER — Encounter: Payer: Self-pay | Admitting: Family Medicine

## 2017-12-02 VITALS — BP 119/79 | HR 81 | Ht 72.0 in | Wt 220.0 lb

## 2017-12-02 DIAGNOSIS — M47895 Other spondylosis, thoracolumbar region: Secondary | ICD-10-CM | POA: Diagnosis not present

## 2017-12-02 DIAGNOSIS — G8929 Other chronic pain: Secondary | ICD-10-CM | POA: Insufficient documentation

## 2017-12-02 DIAGNOSIS — M545 Low back pain: Secondary | ICD-10-CM

## 2017-12-02 DIAGNOSIS — M79604 Pain in right leg: Secondary | ICD-10-CM

## 2017-12-02 NOTE — Patient Instructions (Signed)
We will go ahead with an MRI of your lumbar spine.  I will contact you with the results and next steps.

## 2017-12-04 DIAGNOSIS — M545 Low back pain: Secondary | ICD-10-CM | POA: Insufficient documentation

## 2017-12-04 DIAGNOSIS — M79604 Pain in right leg: Secondary | ICD-10-CM | POA: Insufficient documentation

## 2017-12-04 NOTE — Progress Notes (Addendum)
PCP: Wanda PlumpPaz, Jose E, MD  Subjective:   HPI: Patient is a 42 y.o. male here for low back pain.  Patient reoprts he's had over 1 1/2 years of low back pain. Pain is 6-7/10 and sharp, worse by end of work day. associated with radiation down both legs to feet. Associated numbness. Difficulty getting comfortable. Tried TENS, icy hot, gabapentin, flexeril. Had a lumbar MRI that showed some facet arthropathy over a year ago. Taking voltaren now, started yesterday. No skin changes. No bowel/bladder dysfunction.  Past Medical History:  Diagnosis Date  . Asthma   . H/O intrinsic asthma   . H/O: gout   . Heart murmur    h/o  . Radiculopathy of cervical region 2014   Secondary to new disc protrusion, s/p C6-7 ACDF 08/04/2013    Current Outpatient Medications on File Prior to Visit  Medication Sig Dispense Refill  . albuterol (VENTOLIN HFA) 108 (90 Base) MCG/ACT inhaler Inhale 2 puffs into the lungs every 6 (six) hours as needed for wheezing or shortness of breath. 1 Inhaler 1  . cyclobenzaprine (FLEXERIL) 5 MG tablet Take 5 mg by mouth 2 (two) times daily as needed for muscle spasms.    . diclofenac (VOLTAREN) 75 MG EC tablet Take 1 tablet (75 mg total) by mouth 2 (two) times daily. 60 tablet 1  . nitroGLYCERIN (NITRODUR - DOSED IN MG/24 HR) 0.2 mg/hr patch Apply 1/4th patch to affected ankle, change daily 30 patch 1  . pantoprazole (PROTONIX) 40 MG tablet Take 1 tablet (40 mg total) by mouth daily. 30 tablet 6   No current facility-administered medications on file prior to visit.     Past Surgical History:  Procedure Laterality Date  . Anterior cervical discectomy and fusion  08/04/2013    Allergies  Allergen Reactions  . Penicillins     Social History   Socioeconomic History  . Marital status: Married    Spouse name: Not on file  . Number of children: 3  . Years of education: Not on file  . Highest education level: Not on file  Social Needs  . Financial resource strain: Not  on file  . Food insecurity - worry: Not on file  . Food insecurity - inability: Not on file  . Transportation needs - medical: Not on file  . Transportation needs - non-medical: Not on file  Occupational History  . Occupation: graduate from college - works @ [employer]   Tobacco Use  . Smoking status: Former Smoker    Last attempt to quit: 05/20/2013    Years since quitting: 4.5  . Smokeless tobacco: Never Used  . Tobacco comment: 1 ppd, quit ~ 2014, now vapes  Substance and Sexual Activity  . Alcohol use: Yes    Alcohol/week: 0.0 oz    Comment: 8-10 drinks  . Drug use: No  . Sexual activity: Not on file  Other Topics Concern  . Not on file  Social History Narrative   Lives with wife and children.  Has 3 children.     Went to [employer] in [place], currently not performing    Works at the [employer].  Education: college.    Family History  Problem Relation Age of Onset  . GI Disease Mother   . Glaucoma Father   . Ulcerative colitis Brother   . Healthy Son        x 3  . Breast cancer Maternal Grandmother   . CAD Neg Hx   . Colon  cancer Neg Hx   . Prostate cancer Neg Hx   . Diabetes Neg Hx     BP 119/79   Pulse 81   Ht 6' (1.829 m)   Wt 220 lb (99.8 kg)   BMI 29.84 kg/m   Review of Systems: See HPI above.     Objective:  Physical Exam:  Gen: NAD, comfortable in exam room  Back: No gross deformity, scoliosis. TTP bilateral lumbar paraspinal regions, gluts.  No midline or bony TTP. ROM 10 degrees extension, 60 flexion, painful. Strength LEs 5/5 all muscle groups except bilateral hip flexion 5-/5. 2+ MSRs in patellar and achilles tendons, equal bilaterally. Negative SLRs. Decreased sensation medial right lower leg.  Bilateral hips: No deformity. No tenderness except noted above. FROM with 5/5 strength except bilateral hip flexion 5-/5 Negative logroll bilateral hips Negative fabers and piriformis stretches. Sensation diminished right lower leg  medially.   Assessment & Plan:  1. Low back pain with radiation into both legs - concern he's had progression of symptoms since this started over 1 1/2 years ago.  Associated hip flexion weakness as well.  Will go ahead with MRI of lumbar spine to further assess.  Independently reviewed today's radiographs and no acute abnormalities -  Mild degenerative changes.  Addendum:  MRI reviewed and discussed with patient.  Reassuring without abnormalities.  Encouraged physical therapy - he's going to think about this and let us know if he would like to start.

## 2017-12-04 NOTE — Assessment & Plan Note (Signed)
concern he's had progression of symptoms since this started over 1 1/2 years ago.  Associated hip flexion weakness as well.  Will go ahead with MRI of lumbar spine to further assess.  Independently reviewed today's radiographs and no acute abnormalities -  Mild degenerative changes.

## 2017-12-09 NOTE — Addendum Note (Signed)
Addended by: Kathi SimpersWISE, Jaleena Viviani F on: 12/09/2017 01:21 PM   Modules accepted: Orders

## 2017-12-23 ENCOUNTER — Ambulatory Visit (INDEPENDENT_AMBULATORY_CARE_PROVIDER_SITE_OTHER): Payer: 59

## 2017-12-23 DIAGNOSIS — M545 Low back pain: Secondary | ICD-10-CM

## 2017-12-23 DIAGNOSIS — G8929 Other chronic pain: Secondary | ICD-10-CM | POA: Diagnosis not present

## 2018-01-06 ENCOUNTER — Ambulatory Visit: Payer: 59 | Admitting: Family Medicine

## 2018-01-06 ENCOUNTER — Encounter: Payer: Self-pay | Admitting: Family Medicine

## 2018-01-06 DIAGNOSIS — M79605 Pain in left leg: Secondary | ICD-10-CM

## 2018-01-06 DIAGNOSIS — M25572 Pain in left ankle and joints of left foot: Secondary | ICD-10-CM

## 2018-01-06 DIAGNOSIS — M545 Low back pain: Secondary | ICD-10-CM

## 2018-01-06 DIAGNOSIS — G8929 Other chronic pain: Secondary | ICD-10-CM | POA: Diagnosis not present

## 2018-01-06 DIAGNOSIS — M25571 Pain in right ankle and joints of right foot: Secondary | ICD-10-CM | POA: Diagnosis not present

## 2018-01-06 NOTE — Patient Instructions (Addendum)
Your ankles are doing better. Continue with home exercises - can stop if pain resolves - ideally you back down to doing these 3-4 times a week. Voltaren twice a day if needed. Continue the nitro patches for up to 3 more months. Continue custom orthotics.  Start physical therapy for your back and do home exercises on days you don't go to therapy. Follow up with me in about 2 months for reevaluation.

## 2018-01-07 ENCOUNTER — Encounter: Payer: Self-pay | Admitting: Family Medicine

## 2018-01-07 NOTE — Assessment & Plan Note (Signed)
Improving.  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  Continue voltaren if needed, nitro patches for 3 more months, custom orthotics.  Consider physical therapy.  F/u in 2 months.

## 2018-01-07 NOTE — Addendum Note (Signed)
Addended by: Kathi SimpersWISE, Esteban Kobashigawa F on: 01/07/2018 01:30 PM   Modules accepted: Orders

## 2018-01-07 NOTE — Progress Notes (Signed)
PCP: Wanda Plump, MD  Subjective:   HPI: Patient is a 42 y.o. male here for low back pain, ankle pain.  12/31: Patient reports he has improved some since last visit. Pain 6/10 both ankles, a stiffness - anterior right ankle, medial left ankle. Feels less stiff with nitro patches. Pain worse by end of day. He also has chronic problems with his back. He has tried ibuprofen, gabapentin, mobic, tramadol, aleve. Would like to try something that helps in the evenings when pain is the worst. No numbness, skin changes.  1/7: Patient reoprts he's had over 1 1/2 years of low back pain. Pain is 6-7/10 and sharp, worse by end of work day. associated with radiation down both legs to feet. Associated numbness. Difficulty getting comfortable. Tried TENS, icy hot, gabapentin, flexeril. Had a lumbar MRI that showed some facet arthropathy over a year ago. Taking voltaren now, started yesterday. No skin changes. No bowel/bladder dysfunction.  2/11: Patient reports he's doing well. Some stiffness in right ankle at 4/10 level anterior ankle. Left ankle feels better without swelling or stiffness today. Back is feeling well but he's not worked today. Doing home exercises for ankles. Using inserts and nitro patches for ankles. Worse by end of day. No skin changes, numbness.  Past Medical History:  Diagnosis Date  . Asthma   . H/O intrinsic asthma   . H/O: gout   . Heart murmur    h/o  . Radiculopathy of cervical region 2014   Secondary to new disc protrusion, s/p C6-7 ACDF 08/04/2013    Current Outpatient Medications on File Prior to Visit  Medication Sig Dispense Refill  . albuterol (VENTOLIN HFA) 108 (90 Base) MCG/ACT inhaler Inhale 2 puffs into the lungs every 6 (six) hours as needed for wheezing or shortness of breath. 1 Inhaler 1  . cyclobenzaprine (FLEXERIL) 5 MG tablet Take 5 mg by mouth 2 (two) times daily as needed for muscle spasms.    . diclofenac (VOLTAREN) 75 MG EC tablet  Take 1 tablet (75 mg total) by mouth 2 (two) times daily. 60 tablet 1  . nitroGLYCERIN (NITRODUR - DOSED IN MG/24 HR) 0.2 mg/hr patch Apply 1/4th patch to affected ankle, change daily 30 patch 1  . pantoprazole (PROTONIX) 40 MG tablet Take 1 tablet (40 mg total) by mouth daily. 30 tablet 6   No current facility-administered medications on file prior to visit.     Past Surgical History:  Procedure Laterality Date  . Anterior cervical discectomy and fusion  08/04/2013    Allergies  Allergen Reactions  . Penicillins     Social History   Socioeconomic History  . Marital status: Married    Spouse name: Not on file  . Number of children: 3  . Years of education: Not on file  . Highest education level: Not on file  Social Needs  . Financial resource strain: Not on file  . Food insecurity - worry: Not on file  . Food insecurity - inability: Not on file  . Transportation needs - medical: Not on file  . Transportation needs - non-medical: Not on file  Occupational History  . 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 Smoking status: Former Smoker    Last attempt to quit: 05/20/2013    Years since quitting: 4.6  . Smokeless tobacco: Never Used  . Tobacco comment: 1 ppd, quit ~ 2014, now vapes  Substance and Sexual Activity  . Alcohol use:  Yes    Alcohol/week: 0.0 oz    Comment: 8-10 drinks  . Drug use: No  . Sexual activity: Not on file  Other Topics Concern  . Not on file  Social History Narrative   Lives with wife and children.  Has 3 children.     [place] [employer] [place]    Works at the [employer].  Education: college.    Family History  Problem Relation Age of Onset  . GI Disease Mother   . Glaucoma Father   . Ulcerative colitis Brother   . Healthy Son        x 3  . Breast cancer Maternal Grandmother   . CAD Neg Hx   . Colon cancer Neg Hx   . Prostate cancer Neg Hx   . Diabetes Neg Hx     BP  130/85   Pulse 73   Ht 6' (1.829 m)   Wt 225 lb (102.1 kg)   BMI 30.52 kg/m   Review of Systems: See HPI above.     Objective:  Physical Exam:  Gen: NAD, comfortable in exam room.  Right ankle: Mild overpronation. No gross deformity, swelling, ecchymoses FROM with 5/5 strength. No TTP. Negative ant drawer and talar tilt.   Negative syndesmotic compression. Thompsons test negative. NV intact distally.  Left ankle: Mild overpronation. No gross deformity, swelling, ecchymoses FROM without pain, 5/5 strength. TTP minimally over posterior tibialis tendon.  No other tenderness. Negative ant drawer and talar tilt.   Negative syndesmotic compression. Thompsons test negative. NV intact distally.  Back: No gross deformity, scoliosis. TTP mildly bilateral lumbar paraspinal regions.  No other tenderness. FROM. Strength LEs 5/5 all muscle groups.   2+ MSRs in patellar and achilles tendons, equal bilaterally. Negative SLRs. Sensation intact to light touch bilaterally.   Assessment & Plan:  1. Low back pain with radiation into both legs - MRI was reassuring.  Consistent with lumbar strain.  Start physical therapy and home exercises.  Voltaren twice a day if needed.  Heat for spasms.  2. Bilateral ankle pain - Improving.  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  Continue voltaren if needed, nitro patches for 3 more months, custom orthotics.  Consider physical therapy.  F/u in 2 months.

## 2018-01-07 NOTE — Assessment & Plan Note (Signed)
MRI was reassuring.  Consistent with lumbar strain.  Start physical therapy and home exercises.  Voltaren twice a day if needed.  Heat for spasms.

## 2018-03-03 ENCOUNTER — Encounter: Payer: Self-pay | Admitting: Family Medicine

## 2018-03-03 ENCOUNTER — Ambulatory Visit: Payer: 59 | Admitting: Family Medicine

## 2018-03-03 DIAGNOSIS — M25572 Pain in left ankle and joints of left foot: Secondary | ICD-10-CM

## 2018-03-03 DIAGNOSIS — M25571 Pain in right ankle and joints of right foot: Secondary | ICD-10-CM

## 2018-03-03 DIAGNOSIS — G8929 Other chronic pain: Secondary | ICD-10-CM

## 2018-03-03 NOTE — Patient Instructions (Signed)
Continue with current treatment (the steroids, nitro patches, orthotics). Let me know in 2-3 weeks if pain in your wrist, hand comes back that you would want to do something about it - same for your ankle. Also let me know if you want to do physical therapy for your back.

## 2018-03-11 ENCOUNTER — Encounter: Payer: Self-pay | Admitting: Family Medicine

## 2018-03-11 NOTE — Progress Notes (Signed)
PCP: Wanda PlumpPaz, Jose E, MD  Subjective:   HPI: Patient is a 42 y.o. male here for low back pain, ankle pain.  12/31: Patient reports he has improved some since last visit. Pain 6/10 both ankles, a stiffness - anterior right ankle, medial left ankle. Feels less stiff with nitro patches. Pain worse by end of day. He also has chronic problems with his back. He has tried ibuprofen, gabapentin, mobic, tramadol, aleve. Would like to try something that helps in the evenings when pain is the worst. No numbness, skin changes.  1/7: Patient reoprts he's had over 1 1/2 years of low back pain. Pain is 6-7/10 and sharp, worse by end of work day. associated with radiation down both legs to feet. Associated numbness. Difficulty getting comfortable. Tried TENS, icy hot, gabapentin, flexeril. Had a lumbar MRI that showed some facet arthropathy over a year ago. Taking voltaren now, started yesterday. No skin changes. No bowel/bladder dysfunction.  2/11: Patient reports he's doing well. Some stiffness in right ankle at 4/10 level anterior ankle. Left ankle feels better without swelling or stiffness today. Back is feeling well but he's not worked today. Doing home exercises for ankles. Using inserts and nitro patches for ankles. Worse by end of day. No skin changes, numbness.  4/8: Patient returns feeling improved especially over past few days. He states he was given a steroid IM and prednisone dose pack for upper respiratory infection and pain in ankles has improved. He is using nitro patches, his orthotics. Doing home exercises as well. Had pain in his wrist too but this seems to have resolved with the prednisone. No skin changes, numbness. Pain at most 2/10 now.  Past Medical History:  Diagnosis Date  . Asthma   . H/O intrinsic asthma   . H/O: gout   . Heart murmur    h/o  . Radiculopathy of cervical region 2014   Secondary to new disc protrusion, s/p C6-7 ACDF 08/04/2013    Current  Outpatient Medications on File Prior to Visit  Medication Sig Dispense Refill  . albuterol (VENTOLIN HFA) 108 (90 Base) MCG/ACT inhaler Inhale 2 puffs into the lungs every 6 (six) hours as needed for wheezing or shortness of breath. 1 Inhaler 1  . cyclobenzaprine (FLEXERIL) 5 MG tablet Take 5 mg by mouth 2 (two) times daily as needed for muscle spasms.    . diclofenac (VOLTAREN) 75 MG EC tablet Take 1 tablet (75 mg total) by mouth 2 (two) times daily. 60 tablet 1  . nitroGLYCERIN (NITRODUR - DOSED IN MG/24 HR) 0.2 mg/hr patch Apply 1/4th patch to affected ankle, change daily 30 patch 1  . pantoprazole (PROTONIX) 40 MG tablet Take 1 tablet (40 mg total) by mouth daily. 30 tablet 6   No current facility-administered medications on file prior to visit.     Past Surgical History:  Procedure Laterality Date  . Anterior cervical discectomy and fusion  08/04/2013    Allergies  Allergen Reactions  . Penicillins     Social History   Socioeconomic History  . Marital status: Married    Spouse name: Not on file  . Number of children: 3  . Years of education: Not on file  . Highest education level: Not on file  Occupational History  . Occupation: Buyer, retailgraduate from college - works @ [employer]   Social Needs  . Financial resource strain: Not on file  . Food insecurity:    Worry: Not on file    Inability: Not on  file  . Transportation needs:    Medical: Not on file    Non-medical: Not on file  Tobacco Use  . Smoking status: Former Smoker    Last attempt to quit: 05/20/2013    Years since quitting: 4.8  . Smokeless tobacco: Never Used  . Tobacco comment: 1 ppd, quit ~ 2014, now vapes  Substance and Sexual Activity  . Alcohol use: Yes    Alcohol/week: 0.0 oz    Comment: 8-10 drinks  . Drug use: No  . Sexual activity: Not on file  Lifestyle  . Physical activity:    Days per week: Not on file    Minutes per session: Not on file  . Stress: Not on file  Relationships  . Social  connections:    Talks on phone: Not on file    Gets together: Not on file    Attends religious service: Not on file    Active member of club or organization: Not on file    Attends meetings of clubs or organizations: Not on file    Relationship status: Not on file  . Intimate partner violence:    Fear of current or ex partner: Not on file    Emotionally abused: Not on file    Physically abused: Not on file    Forced sexual activity: Not on file  Other Topics Concern  . Not on file  Social History Narrative   Lives with wife and children.  Has 3 children.     Went to [employer] in [place], currently not performing    Works at the [employer].  Education: college.    Family History  Problem Relation Age of Onset  . GI Disease Mother   . Glaucoma Father   . Ulcerative colitis Brother   . Healthy Son        x 3  . Breast cancer Maternal Grandmother   . CAD Neg Hx   . Colon cancer Neg Hx   . Prostate cancer Neg Hx   . Diabetes Neg Hx     BP 120/86   Pulse 80   Ht 6' (1.829 m)   Wt 215 lb (97.5 kg)   BMI 29.16 kg/m   Review of Systems: See HPI above.     Objective:  Physical Exam:  Gen: NAD, comfortable in exam room  Right ankle: Mild overpronation. No other  gross deformity, swelling, ecchymoses FROM with 5/5 strength No TTP Negative ant drawer and talar tilt.   Negative syndesmotic compression. Thompsons test negative. NV intact distally.  Left ankle: Mild overpronation. No gross deformity, swelling, ecchymoses FROM with 5/5 strength. No TTP Negative ant drawer and talar tilt.   Negative syndesmotic compression. Thompsons test negative. NV intact distally.   Assessment & Plan:  1. Bilateral ankle pain - doing well.  Continue with nitro patches, orthotics, home exercises.  Let us know if pain worsens when off the prednisone.  Consider physical therapy.  Voltaren if needed.

## 2018-03-11 NOTE — Assessment & Plan Note (Signed)
doing well.  Continue with nitro patches, orthotics, home exercises.  Let us know if pain worsens when off the prednisone.  Consider physical therapy.  Voltaren if needed.

## 2018-03-28 ENCOUNTER — Emergency Department (HOSPITAL_BASED_OUTPATIENT_CLINIC_OR_DEPARTMENT_OTHER)
Admission: EM | Admit: 2018-03-28 | Discharge: 2018-03-29 | Disposition: A | Discharge status: Home / Self Care | Payer: 59 | Attending: Emergency Medicine | Admitting: Emergency Medicine

## 2018-03-28 ENCOUNTER — Other Ambulatory Visit: Payer: Self-pay

## 2018-03-28 ENCOUNTER — Encounter (HOSPITAL_BASED_OUTPATIENT_CLINIC_OR_DEPARTMENT_OTHER): Payer: Self-pay | Admitting: *Deleted

## 2018-03-28 DIAGNOSIS — M545 Low back pain: Secondary | ICD-10-CM | POA: Diagnosis present

## 2018-03-28 DIAGNOSIS — Z79899 Other long term (current) drug therapy: Secondary | ICD-10-CM | POA: Diagnosis not present

## 2018-03-28 DIAGNOSIS — J45909 Unspecified asthma, uncomplicated: Secondary | ICD-10-CM | POA: Insufficient documentation

## 2018-03-28 DIAGNOSIS — Z87891 Personal history of nicotine dependence: Secondary | ICD-10-CM | POA: Diagnosis not present

## 2018-03-28 DIAGNOSIS — M5442 Lumbago with sciatica, left side: Secondary | ICD-10-CM | POA: Diagnosis not present

## 2018-03-28 LAB — URINALYSIS, ROUTINE W REFLEX MICROSCOPIC
Glucose, UA: NEGATIVE mg/dL
HGB URINE DIPSTICK: NEGATIVE
Ketones, ur: NEGATIVE mg/dL
Leukocytes, UA: NEGATIVE
NITRITE: NEGATIVE
PH: 5 (ref 5.0–8.0)
Protein, ur: NEGATIVE mg/dL

## 2018-03-28 NOTE — ED Triage Notes (Signed)
Lower back pain. Hx of chronic back pain. He had an MRI that showed a herniation.

## 2018-03-29 MED ORDER — OXYCODONE-ACETAMINOPHEN 5-325 MG PO TABS: 1.0000 | ORAL_TABLET | Freq: Four times a day (QID) | ORAL | 0 refills | Status: DC | PRN

## 2018-03-29 MED ORDER — NAPROXEN 250 MG PO TABS
500.0000 mg | ORAL_TABLET | Freq: Once | ORAL | Status: AC
  Administered 2018-03-29: 500 mg via ORAL
  Filled 2018-03-29: qty 2

## 2018-03-29 MED ORDER — HYDROMORPHONE HCL 1 MG/ML IJ SOLN
2.0000 mg | Freq: Once | INTRAMUSCULAR | Status: AC
  Administered 2018-03-29: 2 mg via INTRAMUSCULAR
  Filled 2018-03-29: qty 2

## 2018-03-29 NOTE — ED Notes (Signed)
Pt reports that he had an MRI on his lumbar area at the end of January, but the pt stated it was normal and he did not have a history of herniation as reported in the initial triage note.

## 2018-03-29 NOTE — ED Provider Notes (Signed)
[facility] Provider Note: Elijah Dell, MD, FACEP  CSN: [id number] MRN: [id number] ARRIVAL: 03/28/18 at 2027 ROOM: [facility]   CHIEF COMPLAINT  Back Pain   HISTORY OF PRESENT ILLNESS  03/29/18 12:42 AM Elijah Pennington is a 42 y.o. male with a history of low back pain.  He had an MRI in January that showed no significant pathology.  He is here with a 2-day history of left sided upper lumbar pain.  The pain is sharp and severe at times.  It is worse with movement of the lower back.  It occasionally radiates to his left leg.  There is no associated numbness or weakness.  He has been taking Aleve with partial relief.  Consultation with the [facility] state controlled substances database reveals the patient has received no opioid prescriptions in the past 2 years.   Past Medical History:  Diagnosis Date  . Asthma   . H/O intrinsic asthma   . H/O: gout   . Heart murmur    h/o  . Radiculopathy of cervical region 2014   Secondary to new disc protrusion, s/p C6-7 ACDF 08/04/2013    Past Surgical History:  Procedure Laterality Date  . Anterior cervical discectomy and fusion  08/04/2013    Family History  Problem Relation Age of Onset  . GI Disease Mother   . Glaucoma Father   . Ulcerative colitis Brother   . Healthy Son        x 3  . Breast cancer Maternal Grandmother   . CAD Neg Hx   . Colon cancer Neg Hx   . Prostate cancer Neg Hx   . Diabetes Neg Hx     Social History   Tobacco Use  . Smoking status: Former Smoker    Last attempt to quit: 05/20/2013    Years since quitting: 4.8  . Smokeless tobacco: Never Used  . Tobacco comment: 1 ppd, quit ~ 2014, now vapes  Substance Use Topics  . Alcohol use: Yes    Alcohol/week: 0.0 oz    Comment: 8-10 drinks  . Drug use: No    Prior to Admission medications   Medication Sig Start Date End Date Taking? Authorizing Provider  albuterol (VENTOLIN HFA) 108 (90 Base) MCG/ACT inhaler Inhale 2 puffs into the  lungs every 6 (six) hours as needed for wheezing or shortness of breath. 05/20/17   Wanda Plump, MD  cyclobenzaprine (FLEXERIL) 5 MG tablet Take 5 mg by mouth 2 (two) times daily as needed for muscle spasms.    [provider]  diclofenac (VOLTAREN) 75 MG EC tablet Take 1 tablet (75 mg total) by mouth 2 (two) times daily. 11/25/17   Lenda Kelp, MD  nitroGLYCERIN (NITRODUR - DOSED IN MG/24 HR) 0.2 mg/hr patch Apply 1/4th patch to affected ankle, change daily 09/30/17   Hudnall, Azucena Fallen, MD  pantoprazole (PROTONIX) 40 MG tablet Take 1 tablet (40 mg total) by mouth daily. 05/20/17   Wanda Plump, MD    Allergies Penicillins   REVIEW OF SYSTEMS  Negative except as noted here or in the History of Present Illness.   PHYSICAL EXAMINATION  Initial Vital Signs Blood pressure (!) 138/100, pulse 73, temperature 99.2 F (37.3 C), temperature source Oral, resp. rate 18, height 6' (1.829 m), weight 97.5 kg (215 lb), SpO2 93 %.  Examination General: Well-developed, well-nourished male in no acute distress; appearance consistent with age of record HENT: normocephalic; atraumatic Eyes: pupils equal, round and reactive  to light; extraocular muscles intact Neck: supple Heart: regular rate and rhythm Lungs: clear to auscultation bilaterally Abdomen: soft; nondistended; nontender; no masses or hepatosplenomegaly; bowel sounds present Back: No lumbar tenderness; negative straight leg raise bilaterally; severe pain on movement of lower back Extremities: No deformity; full range of motion; pulses normal Neurologic: Awake, alert and oriented; motor function intact in all extremities and symmetric; sensation intact in lower extremities and symmetric; no facial droop Skin: Warm and dry Psychiatric: Normal mood and affect   RESULTS  Summary of this visit's results, reviewed by myself:   EKG Interpretation  Date/Time:    Ventricular Rate:    PR Interval:    QRS Duration:   QT Interval:      QTC Calculation:   R Axis:     Text Interpretation:        Laboratory Studies: Results for orders placed or performed during the hospital encounter of 03/28/18 (from the past 24 hour(s))  Urinalysis, Routine w reflex microscopic     Status: Abnormal   Collection Time: 03/28/18 11:07 PM  Result Value Ref Range   Color, Urine YELLOW YELLOW   APPearance CLEAR CLEAR   Specific Gravity, Urine >1.030 (H) 1.005 - 1.030   pH 5.0 5.0 - 8.0   Glucose, UA NEGATIVE NEGATIVE mg/dL   Hgb urine dipstick NEGATIVE NEGATIVE   Bilirubin Urine SMALL (A) NEGATIVE   Ketones, ur NEGATIVE NEGATIVE mg/dL   Protein, ur NEGATIVE NEGATIVE mg/dL   Nitrite NEGATIVE NEGATIVE   Leukocytes, UA NEGATIVE NEGATIVE   Imaging Studies: No results found.  ED COURSE and MDM  Nursing notes and initial vitals signs, including pulse oximetry, reviewed.  Vitals:   03/28/18 2031 03/28/18 2032 03/28/18 2304  BP:  (!) 137/101 (!) 138/100  Pulse:  98 73  Resp:  18 18  Temp:  98.7 F (37.1 C) 99.2 F (37.3 C)  TempSrc:  Oral Oral  SpO2:  96% 93%  Weight: 97.5 kg (215 lb)    Height: 6' (1.829 m)      PROCEDURES    ED DIAGNOSES     ICD-10-CM   1. Acute left-sided low back pain with left-sided sciatica M54.42        Monifah Freehling, Jonny Ruiz, MD 03/29/18 [phone]

## 2018-03-31 ENCOUNTER — Telehealth: Payer: Self-pay | Admitting: Internal Medicine

## 2018-03-31 NOTE — Telephone Encounter (Signed)
Copied from CRM [id number]. Topic: Quick Communication - See Telephone Encounter >> Mar 31, 2018 10:18 AM Raquel Sarna wrote: Pt went to ER for back issues over the weekend.  Pt is needing to know if he will need a referral to a specialist. Please call pt back to advise.

## 2018-03-31 NOTE — Telephone Encounter (Signed)
Called pt and spoke to wife. She stated that he is in severe pain and would like him to be seen for a possible referral. Scheduled a hospital F/U for 04/01/18

## 2018-03-31 NOTE — Telephone Encounter (Signed)
If Pt not improving, will need ER f/u with PCP first.

## 2018-04-01 ENCOUNTER — Ambulatory Visit: Payer: 59 | Admitting: Internal Medicine

## 2018-04-01 ENCOUNTER — Encounter: Payer: Self-pay | Admitting: Internal Medicine

## 2018-04-01 VITALS — BP 126/68 | HR 87 | Temp 98.2°F | Resp 14 | Ht 72.0 in | Wt 217.5 lb

## 2018-04-01 DIAGNOSIS — M545 Low back pain, unspecified: Secondary | ICD-10-CM

## 2018-04-01 DIAGNOSIS — G8929 Other chronic pain: Secondary | ICD-10-CM

## 2018-04-01 DIAGNOSIS — R945 Abnormal results of liver function studies: Secondary | ICD-10-CM

## 2018-04-01 DIAGNOSIS — R7989 Other specified abnormal findings of blood chemistry: Secondary | ICD-10-CM

## 2018-04-01 LAB — HEPATIC FUNCTION PANEL
ALK PHOS: 59 U/L (ref 39–117)
ALT: 21 U/L (ref 0–53)
AST: 20 U/L (ref 0–37)
Albumin: 4 g/dL (ref 3.5–5.2)
BILIRUBIN DIRECT: 0.1 mg/dL (ref 0.0–0.3)
Total Bilirubin: 0.3 mg/dL (ref 0.2–1.2)
Total Protein: 7 g/dL (ref 6.0–8.3)

## 2018-04-01 MED ORDER — CYCLOBENZAPRINE HCL 10 MG PO TABS: 10.0000 mg | ORAL_TABLET | Freq: Every evening | ORAL | 2 refills | Status: DC | PRN

## 2018-04-01 MED ORDER — PREDNISONE 10 MG PO TABS: ORAL_TABLET | ORAL | 0 refills | Status: DC

## 2018-04-01 NOTE — Progress Notes (Signed)
Subjective:    Patient ID: Elijah Pennington, male    DOB: 11/30/1975, 42 y.o.   MRN: [id number]  DOS:  04/01/2018 Type of visit - description : ER follow-up Interval history: He developed left-sided back pain for several days. Went to the ER, urinalysis negative, was prescribed oxycodone.  Has taken it rarely.  The pain is exacerbated by certain positions, specifically bending, sitting or standing for too long. Mild radiation to the buttocks.  Also, needs LFTs recheck.  Review of Systems  Denies fever chills No rash on the back No lower extremity paresthesias No injury, fall.  Past Medical History:  Diagnosis Date  . Asthma   . H/O intrinsic asthma   . H/O: gout   . Heart murmur    h/o  . Radiculopathy of cervical region 2014   Secondary to new disc protrusion, s/p C6-7 ACDF 08/04/2013    Past Surgical History:  Procedure Laterality Date  . Anterior cervical discectomy and fusion  08/04/2013    Social History   Socioeconomic History  . Marital status: Married    Spouse name: Not on file  . Number of children: 3  . Years of education: Not on file  . Highest education level: Not on file  Occupational History  . Occupation: Buyer, retail from college - works @ [employer]  . Financial resource strain: Not on file  . Food insecurity:    Worry: Not on file    Inability: Not on file  . Transportation needs:    Medical: Not on file    Non-medical: Not on file  Tobacco Use  . Smoking status: Former Smoker    Last attempt to quit: 05/20/2013    Years since quitting: 4.8  . Smokeless tobacco: Never Used  . Tobacco comment: 1 ppd, quit ~ 2014, now vapes  Substance and Sexual Activity  . Alcohol use: Yes    Alcohol/week: 0.0 oz    Comment: 8-10 drinks  . Drug use: No  . Sexual activity: Not on file  Lifestyle  . Physical activity:    Days per week: Not on file    Minutes per session: Not on file  . Stress: Not on file  Relationships  . Social  connections:    Talks on phone: Not on file    Gets together: Not on file    Attends religious service: Not on file    Active member of club or organization: Not on file    Attends meetings of clubs or organizations: Not on file    Relationship status: Not on file  . Intimate partner violence:    Fear of current or ex partner: Not on file    Emotionally abused: Not on file    Physically abused: Not on file    Forced sexual activity: Not on file  Other Topics Concern  . Not on file  Social History Narrative   Lives with wife and children.  Has 3 children.     Went to [employer] in [place], currently not performing    Works at the [employer].  Education: college.      Allergies as of 04/01/2018      Reactions   Penicillins       Medication List        Accurate as of 04/01/18 11:59 PM. Always use your most recent med list.          albuterol 108 (90 Base) MCG/ACT inhaler Commonly known  as:  VENTOLIN HFA Inhale 2 puffs into the lungs every 6 (six) hours as needed for wheezing or shortness of breath.   cyclobenzaprine 10 MG tablet Commonly known as:  FLEXERIL Take 1 tablet (10 mg total) by mouth at bedtime as needed for muscle spasms.   oxyCODONE-acetaminophen 5-325 MG tablet Commonly known as:  PERCOCET Take 1 tablet by mouth every 6 (six) hours as needed for severe pain.   pantoprazole 40 MG tablet Commonly known as:  PROTONIX Take 1 tablet (40 mg total) by mouth daily.   predniSONE 10 MG tablet Commonly known as:  DELTASONE 4 tablets x 2 days, 3 tabs x 2 days, 2 tabs x 2 days, 1 tab x 2 days          Objective:   Physical Exam BP 126/68 (BP Location: Left Arm, Patient Position: Sitting, Cuff Size: Small)   Pulse 87   Temp 98.2 F (36.8 C) (Oral)   Resp 14   Ht 6' (1.829 m)   Wt 217 lb 8 oz (98.7 kg)   SpO2 93%   BMI 29.50 kg/m   General:   Well developed, well nourished . NAD.  HEENT:  Normocephalic . Face symmetric, atraumatic MSK: No TTP at  the lumbar spine + Antalgic posture.. Skin: Not pale. Not jaundice Neurologic:  alert & oriented X3.  Speech normal, gait appropriate for age and unassisted.  DTRs and motor symmetric.  Straight leg test negative Psych--  Cognition and judgment appear intact.  Cooperative with normal attention span and concentration.  Behavior appropriate. No anxious or depressed appearing.      Assessment & Plan:   Asessment Cervical surgery s/p C6-7 ACDF (2014)  Continue w/ intermittent sx Chronic low back pain, saw  Dr. Allena Katz, neuro , had NCS, MRIs, rx gaba, flexeril prn GERD  H/o  Asthma, rare sx  H/o Gout H/o Heart murmur  PLAN Acute on chronic low back pain: Short-term management needs to include rest, warm compress, round of prednisone.  Also judicious use of NSAIDs, GI precautions discussed; tylenol.  Historically Flexeril helps, refill sent.  He also got oxycodone from the ER, okay to take it as needed. Long-term management needs to include formal physical therapy with back stretching exercises and strengthening of core muscles.  He agreed to be referred to PT. Increased LFTs: He does not take Tylenol frequently, does not drink but rarely, since the last time he was checked he has lost weight.  Recheck today.  F2F > 25 extensive discussion about benefits of physical therapy

## 2018-04-01 NOTE — Progress Notes (Signed)
Pre visit review using our clinic review tool, if applicable. No additional management support is needed unless otherwise documented below in the visit note. 

## 2018-04-01 NOTE — Patient Instructions (Addendum)
Rest   Heating pad  Prednisone as prescribed x few days  Flexeril as needed  IBUPROFEN (Advil or Motrin) 200 mg 2 tablets every 6 hours as needed for pain.  Always take it with food because may cause gastritis and ulcers.  If you notice nausea, stomach pain, change in the color of stools --->  Stop the medicine and let us know  Tylenol  500 mg OTC 2 tabs a day every 8 hours as needed for pain  Oxycodone if pain is severe

## 2018-04-02 NOTE — Assessment & Plan Note (Signed)
Acute on chronic low back pain: Short-term management needs to include rest, warm compress, round of prednisone.  Also judicious use of NSAIDs, GI precautions discussed; tylenol.  Historically Flexeril helps, refill sent.  He also got oxycodone from the ER, okay to take it as needed. Long-term management needs to include formal physical therapy with back stretching exercises and strengthening of core muscles.  He agreed to be referred to PT. Increased LFTs: He does not take Tylenol frequently, does not drink but rarely, since the last time he was checked he has lost weight.  Recheck today.

## 2018-06-03 ENCOUNTER — Other Ambulatory Visit: Payer: Self-pay | Admitting: Internal Medicine

## 2018-10-03 ENCOUNTER — Ambulatory Visit (INDEPENDENT_AMBULATORY_CARE_PROVIDER_SITE_OTHER): Payer: 59 | Admitting: Internal Medicine

## 2018-10-03 ENCOUNTER — Encounter: Payer: Self-pay | Admitting: Internal Medicine

## 2018-10-03 VITALS — BP 116/80 | HR 86 | Temp 98.6°F | Resp 16 | Ht 72.0 in | Wt 231.4 lb

## 2018-10-03 DIAGNOSIS — Z0000 Encounter for general adult medical examination without abnormal findings: Secondary | ICD-10-CM

## 2018-10-03 DIAGNOSIS — Z23 Encounter for immunization: Secondary | ICD-10-CM | POA: Diagnosis not present

## 2018-10-03 LAB — COMPREHENSIVE METABOLIC PANEL
ALBUMIN: 4.5 g/dL (ref 3.5–5.2)
ALK PHOS: 59 U/L (ref 39–117)
ALT: 40 U/L (ref 0–53)
AST: 36 U/L (ref 0–37)
BUN: 14 mg/dL (ref 6–23)
CO2: 30 mEq/L (ref 19–32)
Calcium: 9.2 mg/dL (ref 8.4–10.5)
Chloride: 103 mEq/L (ref 96–112)
Creatinine, Ser: 0.95 mg/dL (ref 0.40–1.50)
GFR: 92.19 mL/min (ref >60.00)
Glucose, Bld: 104 mg/dL — ABNORMAL HIGH (ref 70–99)
Potassium: 4.7 mEq/L (ref 3.5–5.1)
Sodium: 138 mEq/L (ref 135–145)
TOTAL PROTEIN: 7.2 g/dL (ref 6.0–8.3)
Total Bilirubin: 0.6 mg/dL (ref 0.2–1.2)

## 2018-10-03 LAB — CBC WITH DIFFERENTIAL/PLATELET
BASOS PCT: 0.4 % (ref 0.0–3.0)
Basophils Absolute: 0 10*3/uL (ref 0.0–0.1)
EOS PCT: 5.8 % — AB (ref 0.0–5.0)
Eosinophils Absolute: 0.4 10*3/uL (ref 0.0–0.7)
HCT: 43.9 % (ref 39.0–52.0)
Hemoglobin: 15.1 g/dL (ref 13.0–17.0)
Lymphocytes Relative: 32.1 % (ref 12.0–46.0)
Lymphs Abs: 2 10*3/uL (ref 0.7–4.0)
MCHC: 34.4 g/dL (ref 30.0–36.0)
MCV: 95.9 fl (ref 78.0–100.0)
MONO ABS: 0.5 10*3/uL (ref 0.1–1.0)
Monocytes Relative: 7.5 % (ref 3.0–12.0)
Neutro Abs: 3.3 10*3/uL (ref 1.4–7.7)
Neutrophils Relative %: 54.2 % (ref 43.0–77.0)
Platelets: 206 10*3/uL (ref 150.0–400.0)
RBC: 4.58 Mil/uL (ref 4.22–5.81)
RDW: 13.6 % (ref 11.5–15.5)
WBC: 6.2 10*3/uL (ref 4.0–10.5)

## 2018-10-03 LAB — LIPID PANEL
CHOLESTEROL: 224 mg/dL — AB (ref 0–200)
HDL: 57 mg/dL (ref >39.00)
LDL Cholesterol: 145 mg/dL — ABNORMAL HIGH (ref 0–99)
NonHDL: 166.69
Total CHOL/HDL Ratio: 4
Triglycerides: 106 mg/dL (ref 0.0–149.0)
VLDL: 21.2 mg/dL (ref 0.0–40.0)

## 2018-10-03 NOTE — Progress Notes (Signed)
Subjective:    Patient ID: Elijah Pennington, male    DOB: 11-Dec-1975, 42 y.o.   MRN: [id number]  DOS:  10/03/2018 Type of visit - description : cpx Interval history:  Feeling well in general   Review of Systems  Still has occasional problems with ankle pain, back pain.  Not stretching enough.  Other than above, a 14 point review of systems is negative    Past Medical History:  Diagnosis Date  . Asthma   . H/O intrinsic asthma   . H/O: gout   . Heart murmur    h/o  . Radiculopathy of cervical region 2014   Secondary to new disc protrusion, s/p C6-7 ACDF 08/04/2013    Past Surgical History:  Procedure Laterality Date  . Anterior cervical discectomy and fusion  08/04/2013    Social History   Socioeconomic History  . Marital status: Married    Spouse name: Not on file  . Number of children: 3  . Years of education: Not on file  . Highest education level: Not on file  Occupational History  . Occupation: Buyer, retail from college - works @ [employer] stage [employer]  . Financial resource strain: Not on file  . Food insecurity:    Worry: Not on file    Inability: Not on file  . Transportation needs:    Medical: Not on file    Non-medical: Not on file  Tobacco Use  . Smoking status: Former Smoker    Last attempt to quit: 05/20/2013    Years since quitting: 5.3  . Smokeless tobacco: Never Used  . Tobacco comment: 1 ppd, quit ~ 2014, now vapes  Substance and Sexual Activity  . Alcohol use: Yes    Alcohol/week: 0.0 standard drinks    Comment: 8-10 drinks  . Drug use: No  . Sexual activity: Not on file  Lifestyle  . Physical activity:    Days per week: Not on file    Minutes per session: Not on file  . Stress: Not on file  Relationships  . Social connections:    Talks on phone: Not on file    Gets together: Not on file    Attends religious service: Not on file    Active member of club or organization: Not on file    Attends meetings of clubs or organizations:  Not on file    Relationship status: Not on file  . Intimate partner violence:    Fear of current or ex partner: Not on file    Emotionally abused: Not on file    Physically abused: Not on file    Forced sexual activity: Not on file  Other Topics Concern  . Not on file  Social History Narrative   Lives with wife and children.  Has 3 children.     Went to [employer] in [place], currently not performing    Works at the [employer].        Family History  Problem Relation Age of Onset  . GI Disease Mother   . Glaucoma Father   . Ulcerative colitis Brother   . Healthy Son        x 3  . Breast cancer Maternal Grandmother   . CAD Neg Hx   . Colon cancer Neg Hx   . Prostate cancer Neg Hx   . Diabetes Neg Hx      Allergies as of 10/03/2018      Reactions   Penicillins  Medication List        Accurate as of 10/03/18 11:59 PM. Always use your most recent med list.          albuterol 108 (90 Base) MCG/ACT inhaler Commonly known as:  PROVENTIL HFA;VENTOLIN HFA Inhale 2 puffs into the lungs every 6 (six) hours as needed for wheezing or shortness of breath.   cyclobenzaprine 10 MG tablet Commonly known as:  FLEXERIL Take 1 tablet (10 mg total) by mouth at bedtime as needed for muscle spasms.   pantoprazole 40 MG tablet Commonly known as:  PROTONIX Take 1 tablet (40 mg total) by mouth daily.          Objective:   Physical Exam BP 116/80 (BP Location: Left Arm, Patient Position: Sitting, Cuff Size: Small)   Pulse 86   Temp 98.6 F (37 C) (Oral)   Resp 16   Ht 6' (1.829 m)   Wt 231 lb 6 oz (105 kg)   SpO2 97%   BMI 31.38 kg/m  General: Well developed, NAD, BMI noted Neck: No  thyromegaly  HEENT:  Normocephalic . Face symmetric, atraumatic Lungs:  CTA B Normal respiratory effort, no intercostal retractions, no accessory muscle use. Heart: RRR,  no murmur.  No pretibial edema bilaterally  Abdomen:  Not distended, soft, non-tender. No rebound or  rigidity.   Skin: Exposed areas without rash. Not pale. Not jaundice Neurologic:  alert & oriented X3.  Speech normal, gait appropriate for age and unassisted Strength symmetric and appropriate for age.  Psych: Cognition and judgment appear intact.  Cooperative with normal attention span and concentration.  Behavior appropriate. No anxious or depressed appearing.     Assessment & Plan:   Asessment Cervical surgery s/p C6-7 ACDF (2014)  Continue w/ intermittent sx Chronic low back pain, saw  Dr. Allena Katz, neuro , had NCS, MRIs, rx gaba, flexeril prn GERD  H/o  Asthma, rare sx  H/o Gout H/o Heart murmur  PLAN Back pain, ankle pain: Episodic symptoms, recommend to stretch regularly and stay active.  He is considering see sports medicine again. RTC 1 year

## 2018-10-03 NOTE — Patient Instructions (Addendum)
GO TO THE LAB : Get the blood work     GO TO THE FRONT DESK Schedule your next appointment for a  Physical exam in 1 year  To quit nicotine think about CHANTIX BUPROPION

## 2018-10-03 NOTE — Progress Notes (Signed)
Pre visit review using our clinic review tool, if applicable. No additional management support is needed unless otherwise documented below in the visit note. 

## 2018-10-03 NOTE — Assessment & Plan Note (Signed)
Td 07-2016;  Flu shot today -Labs:CMP, FLP, CBC -- diet-exercise discussed  --Etoh: occ drinks > 2 servings a day, rec moderation

## 2018-10-05 NOTE — Assessment & Plan Note (Signed)
Back pain, ankle pain: Episodic symptoms, recommend to stretch regularly and stay active.  He is considering see sports medicine again. RTC 1 year

## 2019-02-02 ENCOUNTER — Encounter (HOSPITAL_BASED_OUTPATIENT_CLINIC_OR_DEPARTMENT_OTHER): Payer: Self-pay | Admitting: Emergency Medicine

## 2019-02-02 ENCOUNTER — Other Ambulatory Visit: Payer: Self-pay

## 2019-02-02 DIAGNOSIS — Z87891 Personal history of nicotine dependence: Secondary | ICD-10-CM | POA: Diagnosis not present

## 2019-02-02 DIAGNOSIS — J45909 Unspecified asthma, uncomplicated: Secondary | ICD-10-CM | POA: Insufficient documentation

## 2019-02-02 DIAGNOSIS — M5412 Radiculopathy, cervical region: Secondary | ICD-10-CM | POA: Diagnosis not present

## 2019-02-02 DIAGNOSIS — M542 Cervicalgia: Secondary | ICD-10-CM | POA: Diagnosis present

## 2019-02-02 NOTE — ED Triage Notes (Signed)
Pt c/o 8/10 neck pain radiating to his shoulder for the past 3 days getting worse today, per pt he has a hx of neck surgeries.

## 2019-02-03 ENCOUNTER — Emergency Department (HOSPITAL_BASED_OUTPATIENT_CLINIC_OR_DEPARTMENT_OTHER)
Admission: EM | Admit: 2019-02-03 | Discharge: 2019-02-03 | Disposition: A | Discharge status: Home / Self Care | Payer: 59 | Attending: Emergency Medicine | Admitting: Emergency Medicine

## 2019-02-03 DIAGNOSIS — M5412 Radiculopathy, cervical region: Secondary | ICD-10-CM

## 2019-02-03 MED ORDER — OXYCODONE-ACETAMINOPHEN 5-325 MG PO TABS: 1.0000 | ORAL_TABLET | ORAL | 0 refills | Status: DC | PRN

## 2019-02-03 NOTE — ED Provider Notes (Signed)
[facility] Provider Note: [name], MD, FACEP  CSN: [id number] MRN: [id number] ARRIVAL: 02/02/19 at 2314 ROOM: MH11/MH11   CHIEF COMPLAINT  Neck Pain   HISTORY OF PRESENT ILLNESS  02/03/19 1:37 AM Elijah Pennington is a 43 y.o. male with a history of cervical radiculopathy status post anterior cervical discectomy and fusion in 2014.  He is here with about 6 days of pain in his right neck radiating to his right arm in the C6 dermatome.  He rates his pain as a 7 out of 10.  The pain is worse with movement of his neck posteriorly or to the left.  It also hurts with swallowing.  He has some paresthesias in the C6 dermatome as well.  He has been taking Aleve without significant relief.   Past Medical History:  Diagnosis Date  . Asthma   . H/O intrinsic asthma   . H/O: gout   . Heart murmur    h/o  . Radiculopathy of cervical region 2014   Secondary to new disc protrusion, s/p C6-7 ACDF 08/04/2013    Past Surgical History:  Procedure Laterality Date  . Anterior cervical discectomy and fusion  08/04/2013    Family History  Problem Relation Age of Onset  . GI Disease Mother   . Glaucoma Father   . Ulcerative colitis Brother   . Healthy Son        x 3  . Breast cancer Maternal Grandmother   . CAD Neg Hx   . Colon cancer Neg Hx   . Prostate cancer Neg Hx   . Diabetes Neg Hx     Social History   Tobacco Use  . Smoking status: Former Smoker    Last attempt to quit: 05/20/2013    Years since quitting: 5.7  . Smokeless tobacco: Never Used  . Tobacco comment: 1 ppd, quit ~ 2014, now vapes  Substance Use Topics  . Alcohol use: Yes    Alcohol/week: 0.0 standard drinks    Comment: 8-10 drinks  . Drug use: No    Prior to Admission medications   Medication Sig Start Date End Date Taking? Authorizing Provider  albuterol (VENTOLIN HFA) 108 (90 Base) MCG/ACT inhaler Inhale 2 puffs into the lungs every 6 (six) hours as needed for wheezing or shortness of breath.  05/20/17   Wanda Plump, MD  pantoprazole (PROTONIX) 40 MG tablet Take 1 tablet (40 mg total) by mouth daily. 06/04/18   Wanda Plump, MD    Allergies Penicillins   REVIEW OF SYSTEMS  Negative except as noted here or in the History of Present Illness.   PHYSICAL EXAMINATION  Initial Vital Signs Blood pressure (!) 148/99, pulse 88, temperature 98.3 F (36.8 C), temperature source Oral, resp. rate 18, height 6' (1.829 m), weight 104.3 kg, SpO2 100 %.  Examination General: Well-developed, well-nourished male in no acute distress; appearance consistent with age of record HENT: normocephalic; atraumatic Eyes: pupils equal, round and reactive to light; extraocular muscles intact Neck: supple; movement posteriorly or to the left reproduces pain. Heart: regular rate and rhythm Lungs: clear to auscultation bilaterally Abdomen: soft; nondistended; nontender; bowel sounds present Extremities: No deformity; full range of motion Neurologic: Awake, alert and oriented; motor function intact in all extremities and symmetric; no facial droop Skin: Warm and dry Psychiatric: Normal mood and affect   RESULTS  Summary of this visit's results, reviewed by myself:   EKG Interpretation  Date/Time:    Ventricular Rate:  PR Interval:    QRS Duration:   QT Interval:    QTC Calculation:   R Axis:     Text Interpretation:        Laboratory Studies: No results found for this or any previous visit (from the past 24 hour(s)). Imaging Studies: No results found.  ED COURSE and MDM  Nursing notes and initial vitals signs, including pulse oximetry, reviewed.  Vitals:   02/02/19 2322 02/03/19 0141  BP: (!) 148/99 (!) 174/103  Pulse: 88 62  Resp: 18 18  Temp: 98.3 F (36.8 C)   TempSrc: Oral   SpO2: 100% 100%  Weight: 104.3 kg   Height: 6' (1.829 m)    We will treat patient's pain and refer back to his surgeon Dr. Yevette Edwards.  PROCEDURES    ED DIAGNOSES     ICD-10-CM   1. Cervical  radiculopathy at C6 M54.12        Reuel Lamadrid, Jonny Ruiz, MD 02/03/19 [phone]

## 2019-02-03 NOTE — ED Notes (Signed)
Pt verbalizes understanding of d/c instructions and denies any further needs at this time. 

## 2019-03-17 ENCOUNTER — Other Ambulatory Visit: Payer: Self-pay | Admitting: Internal Medicine

## 2019-06-24 ENCOUNTER — Encounter: Payer: Self-pay | Admitting: Internal Medicine

## 2020-05-19 ENCOUNTER — Other Ambulatory Visit: Payer: Self-pay | Admitting: Family Medicine

## 2020-05-19 DIAGNOSIS — M4722 Other spondylosis with radiculopathy, cervical region: Secondary | ICD-10-CM

## 2020-05-19 DIAGNOSIS — M542 Cervicalgia: Secondary | ICD-10-CM

## 2020-06-23 ENCOUNTER — Telehealth: Payer: 59 | Admitting: Nurse Practitioner

## 2020-06-23 DIAGNOSIS — M25519 Pain in unspecified shoulder: Secondary | ICD-10-CM

## 2020-06-23 NOTE — Progress Notes (Signed)
Based on what you shared with me it looks like you have shoulder pain,that should be evaluated in a face to face office visit. I am sorry but we cannot do pain medication in an evisit.    NOTE: If you entered your credit card information for this eVisit, you will not be charged. You may see a "hold" on your card for the $35 but that hold will drop off and you will not have a charge processed.  If you are having a true medical emergency please call 911.     For an urgent face to face visit, [place] has four urgent care centers for your convenience:   . [facility]    [phone]                  Get Driving Directions  [address] [place], [place] [address] . 10 am to 8 pm Monday-Friday . 12 pm to 8 pm Saturday-Sunday   . [facility] at [facility]  [phone]                  Get Driving Directions  [address] [place], [place] [address] . 8 am to 8 pm Monday-Friday . 9 am to 6 pm Saturday . 11 am to 6 pm Sunday   . [facility] at [facility]  [phone]                  Get Driving Directions   [address] [place], [place] [address] . 8 am to 8 pm Monday-Friday . 8 am to 4 pm Saturday-Sunday    . [facility] at [facility] Directions  [phone]  [address] [place], [place] [address]  . Monday-Friday, 12 PM to 6 PM    Your e-visit answers were reviewed by a board certified advanced clinical practitioner to complete your personal care plan.  Thank you for using e-Visits.

## 2020-07-02 ENCOUNTER — Other Ambulatory Visit: Payer: Self-pay

## 2020-07-02 ENCOUNTER — Ambulatory Visit
Admission: RE | Admit: 2020-07-02 | Discharge: 2020-07-02 | Disposition: A | Discharge status: Home / Self Care | Payer: 59 | Source: Ambulatory Visit | Attending: Family Medicine | Admitting: Family Medicine

## 2020-07-02 DIAGNOSIS — M4722 Other spondylosis with radiculopathy, cervical region: Secondary | ICD-10-CM

## 2020-07-02 DIAGNOSIS — M542 Cervicalgia: Secondary | ICD-10-CM

## 2020-07-13 ENCOUNTER — Other Ambulatory Visit: Payer: Self-pay | Admitting: Orthopedic Surgery

## 2020-08-23 NOTE — Pre-Procedure Instructions (Signed)
[facility] #[id number] - [place], [place] - [address] [place] [address] Phone: [phone] Fax: [phone]  [facility] #[id number] - [place], [place] - [address] AT [facility] [address] [place] [address] Phone: [phone] Fax: [phone]    Your procedure is scheduled on Wed., Oct. 6, 2021 from 7:30AM-10:00AM  Report to [facility] Entrance "A" at 5:30AM  Call this number if you have problems the morning of surgery:  [phone]   Remember:  Do not eat after midnight on Oct. 5th  You may drink clear liquids until 3 hours (4:30AM) prior to surgery time .  Clear liquids allowed are:  Water, Juice (no pulp), Clear Tea (no dairy or creamer), Black Coffee (no dairy or creamer), Carbonated beverages, Plain Jell-O, Plain Popsicles.  Enhanced Recovery after Surgery for Orthopedics Enhanced Recovery after Surgery is a protocol used to improve the stress on your body and your recovery after surgery.  Patient Instructions .  Marland Kitchen The day of surgery (if you do NOT have diabetes):  o Drink ONE (1) Pre-Surgery Clear Ensure by ___4:30AM__ am the morning of surgery   o This drink was given to you during your hospital  pre-op appointment visit. o Nothing else to drink after completing the  Pre-Surgery Clear Ensure.         If you have questions, please contact your surgeon's office.     Take these medicines the morning of surgery with A SIP OF WATER:             If Needed:  TraMADol (ULTRAM)           Albuterol Inhaler- bring day of surgery  As of today, STOP taking all Aspirin (unless instructed by your doctor) and Other Aspirin containing products, Vitamins, Fish oils, and Herbal medications. Also stop all NSAIDS i.e. Advil, Ibuprofen, Motrin, Aleve, Anaprox, Naproxen, BC, Goody Powders, and all Supplements.   No Smoking of any kind, Tobacco/Vaping, or Alcohol  products 24 hours prior to your procedure. If you use a Cpap at night, you may bring all equipment for your overnight stay.   Special instructions:   [facility]- Preparing For Surgery  Before surgery, you can play an important role. Because skin is not sterile, your skin needs to be as free of germs as possible. You can reduce the number of germs on your skin by washing with CHG (chlorahexidine gluconate) Soap before surgery.  CHG is an antiseptic cleaner which kills germs and bonds with the skin to continue killing germs even after washing.    Please do not use if you have an allergy to CHG or antibacterial soaps. If your skin becomes reddened/irritated stop using the CHG.  Do not shave (including legs and underarms) for at least 48 hours prior to first CHG shower. It is OK to shave your face.  Please follow these instructions carefully.   1. Shower the NIGHT BEFORE SURGERY and the MORNING OF SURGERY with CHG.   2. If you chose to wash your hair, wash your hair first as usual with your normal shampoo.  3. After you shampoo, rinse your hair and body thoroughly to remove the shampoo.  4. Use CHG as you would any other liquid soap. You can apply CHG directly to the skin and wash gently with a scrungie or a clean washcloth.   5.  Apply the CHG Soap to your body ONLY FROM THE NECK DOWN.  Do not use on open wounds or open sores. Avoid contact with your eyes, ears, mouth and genitals (private parts). Wash Face and genitals (private parts)  with your normal soap.  6. Wash thoroughly, paying special attention to the area where your surgery will be performed.  7. Thoroughly rinse your body with warm water from the neck down.  8. DO NOT shower/wash with your normal soap after using and rinsing off the CHG Soap.  9. Pat yourself dry with a CLEAN TOWEL.  10. Wear CLEAN PAJAMAS to bed the night before surgery, wear comfortable clothes the morning of surgery  11. Place CLEAN SHEETS on your bed  the night of your first shower and DO NOT SLEEP WITH PETS.   Day of Surgery:             Remember to brush your teeth WITH YOUR REGULAR TOOTHPASTE.  Do not wear jewelry.  Do not wear lotions, powders, colognes, or deodorant.  Do not shave 48 hours prior to surgery.  Men may shave face and neck.  Do not bring valuables to the hospital.  [facility] is not responsible for any belongings or valuables.  Contacts, dentures or bridgework may not be worn into surgery.    For patients admitted to the hospital, discharge time will be determined by your treatment team.  Patients discharged the day of surgery will not be allowed to drive home, and someone age 15 and over needs to stay with them for 24 hours.  Please wear clean clothes to the hospital/surgery center.    Please read over the following fact sheets that you were given.

## 2020-08-24 ENCOUNTER — Other Ambulatory Visit: Payer: Self-pay

## 2020-08-24 ENCOUNTER — Encounter (HOSPITAL_COMMUNITY): Payer: Self-pay

## 2020-08-24 ENCOUNTER — Encounter (HOSPITAL_COMMUNITY)
Admission: RE | Admit: 2020-08-24 | Discharge: 2020-08-24 | Disposition: A | Discharge status: Home / Self Care | Payer: 59 | Source: Ambulatory Visit | Attending: Orthopedic Surgery | Admitting: Orthopedic Surgery

## 2020-08-24 DIAGNOSIS — J45909 Unspecified asthma, uncomplicated: Secondary | ICD-10-CM | POA: Diagnosis not present

## 2020-08-24 DIAGNOSIS — Z87891 Personal history of nicotine dependence: Secondary | ICD-10-CM | POA: Insufficient documentation

## 2020-08-24 DIAGNOSIS — Z79899 Other long term (current) drug therapy: Secondary | ICD-10-CM | POA: Diagnosis not present

## 2020-08-24 DIAGNOSIS — M4802 Spinal stenosis, cervical region: Secondary | ICD-10-CM | POA: Insufficient documentation

## 2020-08-24 DIAGNOSIS — Z01818 Encounter for other preprocedural examination: Secondary | ICD-10-CM | POA: Insufficient documentation

## 2020-08-24 LAB — PROTIME-INR
INR: 0.9 (ref 0.8–1.2)
Prothrombin Time: 12 seconds (ref 11.4–15.2)

## 2020-08-24 LAB — CBC WITH DIFFERENTIAL/PLATELET
Abs Immature Granulocytes: 0.04 10*3/uL (ref 0.00–0.07)
Basophils Absolute: 0.1 10*3/uL (ref 0.0–0.1)
Basophils Relative: 1 %
Eosinophils Absolute: 0.6 10*3/uL — ABNORMAL HIGH (ref 0.0–0.5)
Eosinophils Relative: 6 %
HCT: 46.5 % (ref 39.0–52.0)
Hemoglobin: 15.9 g/dL (ref 13.0–17.0)
Immature Granulocytes: 0 %
Lymphocytes Relative: 28 %
Lymphs Abs: 2.5 10*3/uL (ref 0.7–4.0)
MCH: 33 pg (ref 26.0–34.0)
MCHC: 34.2 g/dL (ref 30.0–36.0)
MCV: 96.5 fL (ref 80.0–100.0)
Monocytes Absolute: 0.7 10*3/uL (ref 0.1–1.0)
Monocytes Relative: 7 %
Neutro Abs: 5.3 10*3/uL (ref 1.7–7.7)
Neutrophils Relative %: 58 %
Platelets: 222 10*3/uL (ref 150–400)
RBC: 4.82 MIL/uL (ref 4.22–5.81)
RDW: 12.8 % (ref 11.5–15.5)
WBC: 9.2 10*3/uL (ref 4.0–10.5)
nRBC: 0 % (ref 0.0–0.2)

## 2020-08-24 LAB — COMPREHENSIVE METABOLIC PANEL
ALT: 67 U/L — ABNORMAL HIGH (ref 0–44)
AST: 41 U/L (ref 15–41)
Albumin: 4.2 g/dL (ref 3.5–5.0)
Alkaline Phosphatase: 71 U/L (ref 38–126)
Anion gap: 11 (ref 5–15)
BUN: 11 mg/dL (ref 6–20)
CO2: 22 mmol/L (ref 22–32)
Calcium: 9.8 mg/dL (ref 8.9–10.3)
Chloride: 102 mmol/L (ref 98–111)
Creatinine, Ser: 1 mg/dL (ref 0.61–1.24)
GFR calc Af Amer: >60 mL/min (ref >60)
GFR calc non Af Amer: >60 mL/min (ref >60)
Glucose, Bld: 120 mg/dL — ABNORMAL HIGH (ref 70–99)
Potassium: 4 mmol/L (ref 3.5–5.1)
Sodium: 135 mmol/L (ref 135–145)
Total Bilirubin: 0.8 mg/dL (ref 0.3–1.2)
Total Protein: 7.5 g/dL (ref 6.5–8.1)

## 2020-08-24 LAB — TYPE AND SCREEN
ABO/RH(D): O POS
Antibody Screen: NEGATIVE

## 2020-08-24 LAB — URINALYSIS, ROUTINE W REFLEX MICROSCOPIC
Bilirubin Urine: NEGATIVE
Glucose, UA: NEGATIVE mg/dL
Hgb urine dipstick: NEGATIVE
Ketones, ur: NEGATIVE mg/dL
Leukocytes,Ua: NEGATIVE
Nitrite: NEGATIVE
Protein, ur: NEGATIVE mg/dL
Specific Gravity, Urine: 1.008 (ref 1.005–1.030)
pH: 5 (ref 5.0–8.0)

## 2020-08-24 LAB — APTT: aPTT: 27 seconds (ref 24–36)

## 2020-08-24 LAB — SURGICAL PCR SCREEN
MRSA, PCR: NEGATIVE
Staphylococcus aureus: NEGATIVE

## 2020-08-24 NOTE — Pre-Procedure Instructions (Signed)
[facility] #[id number] - [place]  - [address] [place] [address] Phone: [phone] Fax: [phone]  [facility] #[id number] - [place], [place] - [address] AT [facility] [address] [place] [address] Phone: [phone] Fax: [phone]    Your procedure is scheduled on Oct. 7, 2021 from 7:30AM-10:00AM  Report to [facility] Entrance "A" at 5:30AM  Call this number if you have problems the morning of surgery:  [phone]   Remember:  Do not eat after midnight on Oct. 6  You may drink clear liquids until 3 hours (4:30AM) prior to surgery time .  Clear liquids allowed are:  Water, Juice (no pulp), Clear Tea (no dairy or creamer), Black Coffee (no dairy or creamer), Carbonated beverages, Plain Jell-O, Plain Popsicles.  Enhanced Recovery after Surgery for Orthopedics Enhanced Recovery after Surgery is a protocol used to improve the stress on your body and your recovery after surgery.  Patient Instructions .  Marland Kitchen The day of surgery (if you do NOT have diabetes):  o Drink ONE (1) Pre-Surgery Clear Ensure by ___4:30AM__ am the morning of surgery   o This drink was given to you during your hospital  pre-op appointment visit. o Nothing else to drink after completing the  Pre-Surgery Clear Ensure.         If you have questions, please contact your surgeon's office.     Take these medicines the morning of surgery with A SIP OF WATER:             If Needed:  TraMADol (ULTRAM)           Albuterol Inhaler- bring day of surgery  As of today, STOP taking all Aspirin (unless instructed by your doctor) and Other Aspirin containing products, Vitamins, Fish oils, and Herbal medications. Also stop all NSAIDS i.e. Advil, Ibuprofen, Motrin, Aleve, Anaprox, Naproxen, BC, Goody Powders, and all Supplements.   No Smoking of any kind, Tobacco/Vaping, or Alcohol products  24 hours prior to your procedure. If you use a Cpap at night, you may bring all equipment for your overnight stay.   Special instructions:   [facility]- Preparing For Surgery  Before surgery, you can play an important role. Because skin is not sterile, your skin needs to be as free of germs as possible. You can reduce the number of germs on your skin by washing with CHG (chlorahexidine gluconate) Soap before surgery.  CHG is an antiseptic cleaner which kills germs and bonds with the skin to continue killing germs even after washing.    Please do not use if you have an allergy to CHG or antibacterial soaps. If your skin becomes reddened/irritated stop using the CHG.  Do not shave (including legs and underarms) for at least 48 hours prior to first CHG shower. It is OK to shave your face.  Please follow these instructions carefully.   1. Shower the NIGHT BEFORE SURGERY and the MORNING OF SURGERY with CHG.   2. If you chose to wash your hair, wash your hair first as usual with your normal shampoo.  3. After you shampoo, rinse your hair and body thoroughly to remove the shampoo.  4. Use CHG as you would any other liquid soap. You can apply CHG directly to the skin and wash gently with a scrungie or a clean washcloth.   5. Apply  the CHG Soap to your body ONLY FROM THE NECK DOWN.  Do not use on open wounds or open sores. Avoid contact with your eyes, ears, mouth and genitals (private parts). Wash Face and genitals (private parts)  with your normal soap.  6. Wash thoroughly, paying special attention to the area where your surgery will be performed.  7. Thoroughly rinse your body with warm water from the neck down.  8. DO NOT shower/wash with your normal soap after using and rinsing off the CHG Soap.  9. Pat yourself dry with a CLEAN TOWEL.  10. Wear CLEAN PAJAMAS to bed the night before surgery, wear comfortable clothes the morning of surgery  11. Place CLEAN SHEETS on your bed the night  of your first shower and DO NOT SLEEP WITH PETS.   Day of Surgery:             Remember to brush your teeth WITH YOUR REGULAR TOOTHPASTE.  Do not wear jewelry.  Do not wear lotions, powders, colognes, or deodorant.  Do not shave 48 hours prior to surgery.  Men may shave face and neck.  Do not bring valuables to the hospital.  [facility] is not responsible for any belongings or valuables.  Contacts, dentures or bridgework may not be worn into surgery.    For patients admitted to the hospital, discharge time will be determined by your treatment team.  Patients discharged the day of surgery will not be allowed to drive home, and someone age 45 and over needs to stay with them for 24 hours.  Please wear clean clothes to the hospital/surgery center.    Please read over the following fact sheets that you were given.

## 2020-08-24 NOTE — Anesthesia Preprocedure Evaluation (Addendum)
Anesthesia Evaluation  Patient identified by MRN, date of birth, ID band Patient awake    Reviewed: Allergy & Precautions, NPO status , Patient's Chart, lab work & pertinent test results  History of Anesthesia Complications Negative for: history of anesthetic complications  Airway Mallampati: III  TM Distance: >3 FB Neck ROM: Full    Dental  (+) Dental Advisory Given   Pulmonary COPD,  COPD inhaler, former smoker,  08/30/2020 SARS coronavirus NEG   breath sounds clear to auscultation       Cardiovascular negative cardio ROS   Rhythm:Regular Rate:Normal     Neuro/Psych    GI/Hepatic Neg liver ROS, GERD  Controlled,  Endo/Other  Morbid obesity  Renal/GU negative Renal ROS     Musculoskeletal   Abdominal (+) + obese,   Peds  Hematology negative hematology ROS (+)   Anesthesia Other Findings   Reproductive/Obstetrics                           Anesthesia Physical Anesthesia Plan  ASA: II  Anesthesia Plan: General   Post-op Pain Management:    Induction: Intravenous  PONV Risk Score and Plan: 2 and Ondansetron and Dexamethasone  Airway Management Planned: Oral ETT and Video Laryngoscope Planned  Additional Equipment: None  Intra-op Plan:   Post-operative Plan: Extubation in OR  Informed Consent: I have reviewed the patients History and Physical, chart, labs and discussed the procedure including the risks, benefits and alternatives for the proposed anesthesia with the patient or authorized representative who has indicated his/her understanding and acceptance.     Dental advisory given  Plan Discussed with: CRNA and Surgeon  Anesthesia Plan Comments: (PAT note written by Shonna Chock, PA-C. )      Anesthesia Quick Evaluation

## 2020-08-24 NOTE — Progress Notes (Addendum)
PCP - paz Cardiologist - na   Chest x-ray - na EKG - 08/24/20 Stress Test - na ECHO - na Cardiac Cath - na  Sleep Study - na CPAP -   Fasting Blood Sugar - na Checks Blood Sugar _____ times a day  Blood Thinner Instructions:na Aspirin Instructions: na  ERAS Protcol - yes PRE-SURGERY Ensure given  COVID TEST- 10/5   Anesthesia review: murmur,blood pressure, rechecked  152/104, larger cuff 151/103  Patient denies shortness of breath, fever, cough and chest pain at PAT appointment   All instructions explained to the patient, with a verbal understanding of the material. Patient agrees to go over the instructions while at home for a better understanding. Patient also instructed to self quarantine after being tested for COVID-19. The opportunity to ask questions was provided.

## 2020-08-24 NOTE — Progress Notes (Addendum)
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  History includes former smoker (quit 05/20/13), asthma, gout, C6-7 ACDF 08/04/13. - Dr. Drue Novel notes (dating back to 2013) indicate patient had a remote history of a murmur, with no murmur documented 08/25/12 and at last visit on 10/03/18. Patient didn't initially recall ever being told he had a murmur, but later said maybe one provider > 15 years ago told he had a murmur but did not require additional evaluation. I did not auscultate a murmur on 08/24/20 PAT exam. BMI is consistent with obesity.   Patient denied known history of HTN. He was rushed this morning due to GPS taking him to the wrong location. He had to rush up several flights of stairs in the parking deck. He denied chest pain and SOB (in absence of any asthma exacerbation, which he says are infrequent).  Denied edema, palpitations, syncope.  BP at PAT 145/103 152/104 151/103 130/98 (RUE, manual) He does have a BP monitor at home. He says he checks his BP periodically, last within the past several weeks with numbers about ~10-20 points lower than the readings at PAT (although admits he doesn't recall the specific range). He does think pain is contributing to his elevated readings. Discussed that a significantly elevated BP would lead to case cancellation, so advised that he monitor his BP at home between now and surgery and if DBP is running > 100 then to please contact Dr. Drue Novel' office for further recommendations. I have left an update with Lupita Leash at Dr. Marshell Levan office regarding PAT BP readings and instructions  given to patient--if Dr. Yevette Edwards has additional recommendations then he will need to contact Mr. Antonellis.   Presurgical COVID-19 test is scheduled for 08/30/2020.  ADDENDUM 08/31/20 11:50 AM: Dr. Yevette Edwards requested patient be re-evaluated by primary care for preoperative evaluation. He was seen on 08/30/20 by Sandford Craze, NP. His BP was better at 129/89. She did not recommend starting any new medication. She did add A1c given glucose of 120 with PAT labs, although no result yet.  His 08/30/2020 COVID-19 test was negative. She classified him as "Low Risk"/"medically cleared" on preoperative risk assessment form.    VS: BP (!) 130/98 Comment: Manual, RUE  Pulse 88   Temp 37.1 C (Oral)   Resp 18   Ht 6' (1.829 m)   Wt 112.6 kg   SpO2 96%   BMI 33.66 kg/m   Provider PPE included N95 and face shield. Patient wearing face mask. Heart RRR, no murmur noted. Lungs clear. No carotid bruits noted. He does have a beard--he said Dr. Yevette Edwards did not ask him to trim it for surgery--he said he also had a beard with 2014 ACDF but thinks staff had to shave his neck more at the hospital. BP Readings from Last 3 Encounters:  08/24/20 (!) 145/103  02/03/19 (!) 174/103  10/03/18 116/80  02/03/19 was an ED visit for neck pain. 10/03/18 was for annual physical exam.   PROVIDERS: Wanda Plump, MD is PCP, last visit 10/03/18.    LABS: Labs reviewed: Acceptable for surgery. (all labs ordered are listed, but only  abnormal results are displayed)  Labs Reviewed  CBC WITH DIFFERENTIAL/PLATELET - Abnormal; Notable for the following components:      Result Value   Eosinophils Absolute 0.6 (*)    All other components within normal limits  COMPREHENSIVE METABOLIC PANEL - Abnormal; Notable for the following components:   Glucose, Bld 120 (*)    ALT 67 (*)    All other components within normal limits  URINALYSIS, ROUTINE W REFLEX MICROSCOPIC - Abnormal; Notable for the following components:   Color, Urine  STRAW (*)    All other components within normal limits  SURGICAL PCR SCREEN  APTT  PROTIME-INR  TYPE AND SCREEN    IMAGES: MRI C-spine 07/02/20: IMPRESSION: - Redemonstrated sequela of prior C6-C7 ACDF. No significant stenosis at this level. - At C5-C6, there is moderate disc degeneration which has progressed as compared to the prior MRI of 07/25/2015. Right greater than left disc osteophyte ridge/uncinate hypertrophy. Facet hypertrophy. Progressive moderate to moderately severe right neural foraminal narrowing. Correlate for right C6 radiculopathy. - Cervical spondylosis is otherwise unchanged. No significant canal stenosis at the remaining levels. Additional sites of mild foraminal narrowing as described.   EKG: 08/24/20: NSR   CV: N/A   Past Medical History:  Diagnosis Date  . Asthma   . H/O intrinsic asthma   . H/O: gout   . Heart murmur    h/o, pt. denies  . Radiculopathy of cervical region 2014   Secondary to new disc protrusion, s/p C6-7 ACDF 08/04/2013    Past Surgical History:  Procedure Laterality Date  . Anterior cervical discectomy and fusion  08/04/2013    MEDICATIONS: . albuterol (VENTOLIN HFA) 108 (90 Base) MCG/ACT inhaler  . oxyCODONE-acetaminophen (PERCOCET) 5-325 MG tablet  . pantoprazole (PROTONIX) 40 MG tablet  . traMADol (ULTRAM) 50 MG tablet   No current facility-administered medications for this encounter.  Per current med list, he is not taking Percocet or Protonix.   Shonna Chock, PA-C Surgical Short Stay/Anesthesiology [facility] Phone [phone] [facility] Phone [phone] 08/24/2020 12:56 PM

## 2020-08-29 ENCOUNTER — Ambulatory Visit: Payer: 59 | Admitting: Family

## 2020-08-30 ENCOUNTER — Ambulatory Visit: Payer: 59 | Admitting: Family

## 2020-08-30 ENCOUNTER — Other Ambulatory Visit: Payer: Self-pay

## 2020-08-30 ENCOUNTER — Ambulatory Visit (HOSPITAL_BASED_OUTPATIENT_CLINIC_OR_DEPARTMENT_OTHER)
Admission: RE | Admit: 2020-08-30 | Discharge: 2020-08-30 | Disposition: A | Discharge status: Home / Self Care | Payer: 59 | Source: Ambulatory Visit | Attending: Family | Admitting: Family

## 2020-08-30 ENCOUNTER — Other Ambulatory Visit (HOSPITAL_COMMUNITY)
Admission: RE | Admit: 2020-08-30 | Discharge: 2020-08-30 | Disposition: A | Discharge status: Home / Self Care | Payer: 59 | Source: Ambulatory Visit | Attending: Orthopedic Surgery | Admitting: Orthopedic Surgery

## 2020-08-30 ENCOUNTER — Encounter: Payer: Self-pay | Admitting: Family

## 2020-08-30 VITALS — BP 129/89 | HR 95 | Temp 98.6°F | Resp 16 | Ht 72.0 in | Wt 245.4 lb

## 2020-08-30 DIAGNOSIS — R739 Hyperglycemia, unspecified: Secondary | ICD-10-CM | POA: Diagnosis not present

## 2020-08-30 DIAGNOSIS — Z01812 Encounter for preprocedural laboratory examination: Secondary | ICD-10-CM | POA: Insufficient documentation

## 2020-08-30 DIAGNOSIS — Z01818 Encounter for other preprocedural examination: Secondary | ICD-10-CM | POA: Diagnosis not present

## 2020-08-30 DIAGNOSIS — R030 Elevated blood-pressure reading, without diagnosis of hypertension: Secondary | ICD-10-CM | POA: Diagnosis not present

## 2020-08-30 DIAGNOSIS — Z20822 Contact with and (suspected) exposure to covid-19: Secondary | ICD-10-CM | POA: Insufficient documentation

## 2020-08-30 DIAGNOSIS — R945 Abnormal results of liver function studies: Secondary | ICD-10-CM | POA: Diagnosis not present

## 2020-08-30 DIAGNOSIS — R7989 Other specified abnormal findings of blood chemistry: Secondary | ICD-10-CM

## 2020-08-30 LAB — SARS CORONAVIRUS 2 (TAT 6-24 HRS): SARS Coronavirus 2: NEGATIVE

## 2020-08-30 NOTE — Progress Notes (Signed)
Subjective:    Patient ID: Elijah Pennington, male    DOB: 07/29/1976, 44 y.o.   MRN: [id number]  HPI  Patient is a 44 yr old male who presents for pre-operative evaluation.  He is scheduled for ANTERIOR CERVICAL DECOMPRESSION FUSION CERVICAL 5-6 WITH INSTRUMENTATION AND ALLOGRAFT with Dr. Marissa Nestle. His surgery is scheduled in 2 days.   He reports that in 2014 he had colapse of c6- he had fusion at that time.  This helped for some time.  He reports that some point in 2019 his neck pain became "pretty unbearable."He repots pain that radiates down the right shoulder and into the right hand. Has intermittent numbness, weakness.   Review of Systems  Constitutional: Negative for unexpected weight change.  HENT: Negative for hearing loss and rhinorrhea.   Eyes: Negative for visual disturbance.  Respiratory: Negative for cough and shortness of breath.   Cardiovascular: Negative for chest pain.  Gastrointestinal: Negative for blood in stool, constipation and diarrhea.  Genitourinary: Negative for difficulty urinating, dysuria, frequency and hematuria.  Musculoskeletal: Positive for arthralgias (just neck pain.  some right ankle pain). Negative for myalgias.  Neurological: Negative for headaches.  Hematological: Negative for adenopathy.  Psychiatric/Behavioral:       Denies depression/anxiety   Past Medical History:  Diagnosis Date  . Asthma   . H/O intrinsic asthma   . H/O: gout   . Heart murmur    h/o, pt. denies  . Radiculopathy of cervical region 2014   Secondary to new disc protrusion, s/p C6-7 ACDF 08/04/2013     Social History   Socioeconomic History  . Marital status: Married    Spouse name: Not on file  . Number of children: 3  . Years of education: Not on file  . Highest education level: Not on file  Occupational History  . 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 Smoking status: Former Smoker    Years: 20.00    Quit date: 05/20/2013      Years since quitting: 7.2  . Smokeless tobacco: Never Used  . Tobacco comment: 1 ppd, quit ~ 2014, now vapes  Vaping Use  . Vaping Use: Every day  Substance and Sexual Activity  . Alcohol use: Yes    Alcohol/week: 0.0 standard drinks    Comment: 8-10 drinks  . Drug use: No  . Sexual activity: Not on file  Other Topics Concern  . Not on file  Social History Narrative   Lives with wife and children.  Has 3 children.     Went to [employer] in [place], currently not performing    Works at the [employer].      Social Determinants of Health   Financial Resource Strain:   . Difficulty of Paying Living Expenses: Not on file  Food Insecurity:   . Worried About Programme researcher, broadcasting/film/video in the Last Year: Not on file  . Ran Out of Food in the Last Year: Not on file  Transportation Needs:   . Lack of Transportation (Medical): Not on file  . Lack of Transportation (Non-Medical): Not on file  Physical Activity:   . Days of Exercise per Week: Not on file  . Minutes of Exercise per Session: Not on file  Stress:   . Feeling of Stress : Not on file  Social Connections:   . Frequency of Communication with Friends and Family: Not on file  . Frequency of Social Gatherings with  Friends and Family: Not on file  . Attends Religious Services: Not on file  . Active Member of Clubs or Organizations: Not on file  . Attends Banker Meetings: Not on file  . Marital Status: Not on file  Intimate Partner Violence:   . Fear of Current or Ex-Partner: Not on file  . Emotionally Abused: Not on file  . Physically Abused: Not on file  . Sexually Abused: Not on file    Past Surgical History:  Procedure Laterality Date  . Anterior cervical discectomy and fusion  08/04/2013    Family History  Problem Relation Age of Onset  . GI Disease Mother   . Glaucoma Father   . Ulcerative colitis Brother   . Healthy Son        x 3  . Breast cancer Maternal Grandmother   . CAD Neg Hx   . Colon  cancer Neg Hx   . Prostate cancer Neg Hx   . Diabetes Neg Hx     Allergies  Allergen Reactions  . Penicillins Hives    Current Outpatient Medications on File Prior to Visit  Medication Sig Dispense Refill  . albuterol (VENTOLIN HFA) 108 (90 Base) MCG/ACT inhaler Inhale 2 puffs into the lungs every 6 (six) hours as needed for wheezing or shortness of breath. 1 Inhaler 1  . traMADol (ULTRAM) 50 MG tablet Take by mouth every 6 (six) hours as needed.     No current facility-administered medications on file prior to visit.    BP 129/89 (BP Location: Right Arm, Patient Position: Sitting, Cuff Size: Large)   Pulse 95   Temp 98.6 F (37 C) (Oral)   Resp 16   Ht 6' (1.829 m)   Wt 245 lb 6.4 oz (111.3 kg)   SpO2 100%   BMI 33.28 kg/m       Objective:   Physical Exam Constitutional:      General: He is not in acute distress.    Appearance: He is well-developed.  HENT:     Head: Normocephalic and atraumatic.  Cardiovascular:     Rate and Rhythm: Normal rate and regular rhythm.     Heart sounds: No murmur heard.   Pulmonary:     Effort: Pulmonary effort is normal. No respiratory distress.     Breath sounds: Normal breath sounds. No wheezing or rales.  Musculoskeletal:        General: No swelling.     Cervical back: Neck supple.  Lymphadenopathy:     Cervical: No cervical adenopathy.  Skin:    General: Skin is warm and dry.  Neurological:     Mental Status: He is alert and oriented to person, place, and time.  Psychiatric:        Behavior: Behavior normal.        Thought Content: Thought content normal.           Assessment & Plan:  Pre-operative evaluation- I reviewed lab work and EKG performed in preadmission testing (see abnormalities below).   I have asked him to complete a chest x-ray today as well.    Hyperglycemia- He had an elevated fasting glucose of 120 as part of his pre-op testing.  Will obtain A1C.   Abnormal LFT- ALT was also mildly elevated at his  pre-op testing at 67. We discussed that a common cause for this is fatty liver.  Discussed importance of low fat diet, exercise and weight loss.     Elevated blood pressure reading-  BP is repeated here today and is acceptable. I have advised him to follow up with his PCP in 3 months. LFT's and blood pressure can be rechecked at that time.   BP Readings from Last 3 Encounters:  08/30/20 129/89  08/24/20 (!) 130/98  02/03/19 (!) 174/103    This visit occurred during the SARS-CoV-2 public health emergency.  Safety protocols were in place, including screening questions prior to the visit, additional usage of staff PPE, and extensive cleaning of exam room while observing appropriate contact time as indicated for disinfecting solutions.

## 2020-08-30 NOTE — Patient Instructions (Signed)
Please complete lab work prior to leaving. Complete x-ray on the first floor. Good luck with your upcoming surgery.

## 2020-08-31 MED ORDER — VANCOMYCIN HCL 1500 MG/300ML IV SOLN
1500.0000 mg | INTRAVENOUS | Status: AC
  Administered 2020-09-01: 1500 mg via INTRAVENOUS
  Filled 2020-08-31: qty 300

## 2020-09-01 ENCOUNTER — Ambulatory Visit (HOSPITAL_COMMUNITY): Payer: 59

## 2020-09-01 ENCOUNTER — Ambulatory Visit (HOSPITAL_COMMUNITY): Payer: 59 | Admitting: Vascular Surgery

## 2020-09-01 ENCOUNTER — Encounter (HOSPITAL_COMMUNITY): Payer: Self-pay | Admitting: Orthopedic Surgery

## 2020-09-01 ENCOUNTER — Ambulatory Visit (HOSPITAL_COMMUNITY): Payer: 59 | Admitting: Physician Assistant

## 2020-09-01 ENCOUNTER — Ambulatory Visit (HOSPITAL_COMMUNITY)
Admission: RE | Disposition: A | Discharge status: Home / Self Care | Payer: Self-pay | Source: Home / Self Care | Attending: Orthopedic Surgery

## 2020-09-01 ENCOUNTER — Other Ambulatory Visit: Payer: Self-pay

## 2020-09-01 ENCOUNTER — Observation Stay (HOSPITAL_COMMUNITY)
Admission: RE | Admit: 2020-09-01 | Discharge: 2020-09-02 | Disposition: A | Discharge status: Home / Self Care | Payer: 59 | Attending: Orthopedic Surgery | Admitting: Orthopedic Surgery

## 2020-09-01 DIAGNOSIS — Z87891 Personal history of nicotine dependence: Secondary | ICD-10-CM | POA: Insufficient documentation

## 2020-09-01 DIAGNOSIS — J45909 Unspecified asthma, uncomplicated: Secondary | ICD-10-CM | POA: Insufficient documentation

## 2020-09-01 DIAGNOSIS — M50122 Cervical disc disorder at C5-C6 level with radiculopathy: Secondary | ICD-10-CM | POA: Diagnosis not present

## 2020-09-01 DIAGNOSIS — Z419 Encounter for procedure for purposes other than remedying health state, unspecified: Secondary | ICD-10-CM

## 2020-09-01 DIAGNOSIS — M5410 Radiculopathy, site unspecified: Secondary | ICD-10-CM | POA: Diagnosis present

## 2020-09-01 DIAGNOSIS — M79601 Pain in right arm: Secondary | ICD-10-CM | POA: Diagnosis present

## 2020-09-01 HISTORY — PX: ANTERIOR CERVICAL DECOMP/DISCECTOMY FUSION: SHX1161

## 2020-09-01 LAB — ABO/RH: ABO/RH(D): O POS

## 2020-09-01 SURGERY — ANTERIOR CERVICAL DECOMPRESSION/DISCECTOMY FUSION 1 LEVEL
Anesthesia: General | Site: Neck

## 2020-09-01 MED ORDER — HYDROMORPHONE HCL 1 MG/ML IJ SOLN
INTRAMUSCULAR | Status: AC
Start: 2020-09-01 — End: ?
  Filled 2020-09-01: qty 0.5

## 2020-09-01 MED ORDER — OXYCODONE HCL 5 MG/5ML PO SOLN: 5.0000 mg | Freq: Once | ORAL | Status: DC | PRN

## 2020-09-01 MED ORDER — METHOCARBAMOL 1000 MG/10ML IJ SOLN
500.0000 mg | Freq: Four times a day (QID) | INTRAVENOUS | Status: DC | PRN
  Filled 2020-09-01: qty 5

## 2020-09-01 MED ORDER — MEPERIDINE HCL 25 MG/ML IJ SOLN: 6.2500 mg | INTRAMUSCULAR | Status: DC | PRN

## 2020-09-01 MED ORDER — PROPOFOL 10 MG/ML IV BOLUS
INTRAVENOUS | Status: AC
  Filled 2020-09-01: qty 40

## 2020-09-01 MED ORDER — ACETAMINOPHEN 650 MG RE SUPP: 650.0000 mg | RECTAL | Status: DC | PRN

## 2020-09-01 MED ORDER — MIDAZOLAM HCL 2 MG/2ML IJ SOLN
INTRAMUSCULAR | Status: AC
  Filled 2020-09-01: qty 2

## 2020-09-01 MED ORDER — ROCURONIUM BROMIDE 100 MG/10ML IV SOLN
INTRAVENOUS | Status: DC | PRN
  Administered 2020-09-01: 10 mg via INTRAVENOUS
  Administered 2020-09-01: 50 mg via INTRAVENOUS
  Administered 2020-09-01: 20 mg via INTRAVENOUS
  Administered 2020-09-01: 10 mg via INTRAVENOUS
  Administered 2020-09-01: 20 mg via INTRAVENOUS

## 2020-09-01 MED ORDER — METHOCARBAMOL 500 MG PO TABS
500.0000 mg | ORAL_TABLET | Freq: Four times a day (QID) | ORAL | Status: DC | PRN
  Administered 2020-09-01 – 2020-09-02 (×3): 500 mg via ORAL
  Filled 2020-09-01 (×3): qty 1

## 2020-09-01 MED ORDER — DEXMEDETOMIDINE (PRECEDEX) IN NS 20 MCG/5ML (4 MCG/ML) IV SYRINGE
PREFILLED_SYRINGE | INTRAVENOUS | Status: DC | PRN
  Administered 2020-09-01 (×4): 4 ug via INTRAVENOUS

## 2020-09-01 MED ORDER — ALBUTEROL SULFATE HFA 108 (90 BASE) MCG/ACT IN AERS
2.0000 | INHALATION_SPRAY | Freq: Four times a day (QID) | RESPIRATORY_TRACT | Status: DC | PRN
  Filled 2020-09-01: qty 6.7

## 2020-09-01 MED ORDER — LIDOCAINE 2% (20 MG/ML) 5 ML SYRINGE
INTRAMUSCULAR | Status: DC | PRN
  Administered 2020-09-01: 50 mg via INTRAVENOUS

## 2020-09-01 MED ORDER — HYDROMORPHONE HCL 1 MG/ML IJ SOLN
INTRAMUSCULAR | Status: DC | PRN
  Administered 2020-09-01: .5 mg via INTRAVENOUS

## 2020-09-01 MED ORDER — VANCOMYCIN HCL IN DEXTROSE 1-5 GM/200ML-% IV SOLN
INTRAVENOUS | Status: AC
  Filled 2020-09-01: qty 200

## 2020-09-01 MED ORDER — LIDOCAINE 2% (20 MG/ML) 5 ML SYRINGE
INTRAMUSCULAR | Status: AC
  Filled 2020-09-01: qty 5

## 2020-09-01 MED ORDER — HYDROMORPHONE HCL 1 MG/ML IJ SOLN
INTRAMUSCULAR | Status: AC
  Filled 2020-09-01: qty 1

## 2020-09-01 MED ORDER — ZOLPIDEM TARTRATE 5 MG PO TABS: 5.0000 mg | ORAL_TABLET | Freq: Every evening | ORAL | Status: DC | PRN

## 2020-09-01 MED ORDER — SODIUM CHLORIDE 0.9% FLUSH: 3.0000 mL | INTRAVENOUS | Status: DC | PRN

## 2020-09-01 MED ORDER — POVIDONE-IODINE 7.5 % EX SOLN
Freq: Once | CUTANEOUS | Status: DC
  Filled 2020-09-01: qty 118

## 2020-09-01 MED ORDER — PHENYLEPHRINE HCL (PRESSORS) 10 MG/ML IV SOLN
INTRAVENOUS | Status: AC
  Filled 2020-09-01: qty 1

## 2020-09-01 MED ORDER — PROMETHAZINE HCL 25 MG/ML IJ SOLN: 6.2500 mg | INTRAMUSCULAR | Status: DC | PRN

## 2020-09-01 MED ORDER — SODIUM CHLORIDE 0.9 % IV SOLN: 250.0000 mL | INTRAVENOUS | Status: DC

## 2020-09-01 MED ORDER — PANTOPRAZOLE SODIUM 40 MG IV SOLR: 40.0000 mg | Freq: Every day | INTRAVENOUS | Status: DC

## 2020-09-01 MED ORDER — DEXAMETHASONE SODIUM PHOSPHATE 10 MG/ML IJ SOLN
INTRAMUSCULAR | Status: AC
  Filled 2020-09-01: qty 1

## 2020-09-01 MED ORDER — HYDROMORPHONE HCL 1 MG/ML IJ SOLN
0.2500 mg | INTRAMUSCULAR | Status: DC | PRN
  Administered 2020-09-01 (×2): 0.5 mg via INTRAVENOUS

## 2020-09-01 MED ORDER — OXYCODONE-ACETAMINOPHEN 5-325 MG PO TABS: 1.0000 | ORAL_TABLET | ORAL | 0 refills | Status: DC | PRN

## 2020-09-01 MED ORDER — ROCURONIUM BROMIDE 10 MG/ML (PF) SYRINGE
PREFILLED_SYRINGE | INTRAVENOUS | Status: AC
  Filled 2020-09-01: qty 10

## 2020-09-01 MED ORDER — MIDAZOLAM HCL 2 MG/2ML IJ SOLN: 0.5000 mg | Freq: Once | INTRAMUSCULAR | Status: DC | PRN

## 2020-09-01 MED ORDER — MIDAZOLAM HCL 5 MG/5ML IJ SOLN
INTRAMUSCULAR | Status: DC | PRN
  Administered 2020-09-01: 2 mg via INTRAVENOUS

## 2020-09-01 MED ORDER — ONDANSETRON HCL 4 MG/2ML IJ SOLN
INTRAMUSCULAR | Status: DC | PRN
  Administered 2020-09-01 (×2): 4 mg via INTRAVENOUS

## 2020-09-01 MED ORDER — PHENYLEPHRINE 40 MCG/ML (10ML) SYRINGE FOR IV PUSH (FOR BLOOD PRESSURE SUPPORT)
PREFILLED_SYRINGE | INTRAVENOUS | Status: DC | PRN
  Administered 2020-09-01: 80 ug via INTRAVENOUS

## 2020-09-01 MED ORDER — FENTANYL CITRATE (PF) 250 MCG/5ML IJ SOLN
INTRAMUSCULAR | Status: AC
Start: 2020-09-01 — End: ?
  Filled 2020-09-01: qty 5

## 2020-09-01 MED ORDER — BISACODYL 5 MG PO TBEC: 5.0000 mg | DELAYED_RELEASE_TABLET | Freq: Every day | ORAL | Status: DC | PRN

## 2020-09-01 MED ORDER — THROMBIN 20000 UNITS EX SOLR
CUTANEOUS | Status: DC | PRN
  Administered 2020-09-01: 20000 [IU] via TOPICAL

## 2020-09-01 MED ORDER — OXYCODONE HCL 5 MG PO TABS: 5.0000 mg | ORAL_TABLET | Freq: Once | ORAL | Status: DC | PRN

## 2020-09-01 MED ORDER — THROMBIN (RECOMBINANT) 20000 UNITS EX SOLR
CUTANEOUS | Status: AC
  Filled 2020-09-01: qty 20000

## 2020-09-01 MED ORDER — PROPOFOL 10 MG/ML IV BOLUS
INTRAVENOUS | Status: DC | PRN
  Administered 2020-09-01: 50 mg via INTRAVENOUS
  Administered 2020-09-01: 150 mg via INTRAVENOUS

## 2020-09-01 MED ORDER — EPINEPHRINE PF 1 MG/ML IJ SOLN
INTRAMUSCULAR | Status: AC
  Filled 2020-09-01: qty 1

## 2020-09-01 MED ORDER — PHENYLEPHRINE HCL-NACL 10-0.9 MG/250ML-% IV SOLN
INTRAVENOUS | Status: DC | PRN
  Administered 2020-09-01: 20 ug/min via INTRAVENOUS

## 2020-09-01 MED ORDER — ALBUTEROL SULFATE HFA 108 (90 BASE) MCG/ACT IN AERS
INHALATION_SPRAY | RESPIRATORY_TRACT | Status: AC
  Filled 2020-09-01: qty 6.7

## 2020-09-01 MED ORDER — PHENOL 1.4 % MT LIQD: 1.0000 | OROMUCOSAL | Status: DC | PRN

## 2020-09-01 MED ORDER — ALUM & MAG HYDROXIDE-SIMETH 200-200-20 MG/5ML PO SUSP
30.0000 mL | Freq: Four times a day (QID) | ORAL | Status: DC | PRN
  Administered 2020-09-01 – 2020-09-02 (×3): 30 mL via ORAL
  Filled 2020-09-01 (×3): qty 30

## 2020-09-01 MED ORDER — FLEET ENEMA 7-19 GM/118ML RE ENEM: 1.0000 | ENEMA | Freq: Once | RECTAL | Status: DC | PRN

## 2020-09-01 MED ORDER — PANTOPRAZOLE SODIUM 40 MG PO TBEC
40.0000 mg | DELAYED_RELEASE_TABLET | Freq: Every day | ORAL | Status: DC
  Administered 2020-09-01: 40 mg via ORAL
  Filled 2020-09-01: qty 1

## 2020-09-01 MED ORDER — ORAL CARE MOUTH RINSE: 15.0000 mL | Freq: Once | OROMUCOSAL | Status: AC

## 2020-09-01 MED ORDER — DEXAMETHASONE SODIUM PHOSPHATE 10 MG/ML IJ SOLN
INTRAMUSCULAR | Status: DC | PRN
  Administered 2020-09-01: 10 mg via INTRAVENOUS

## 2020-09-01 MED ORDER — ONDANSETRON HCL 4 MG/2ML IJ SOLN
INTRAMUSCULAR | Status: AC
  Filled 2020-09-01: qty 2

## 2020-09-01 MED ORDER — CHLORHEXIDINE GLUCONATE 0.12 % MT SOLN
15.0000 mL | Freq: Once | OROMUCOSAL | Status: AC
  Administered 2020-09-01: 15 mL via OROMUCOSAL
  Filled 2020-09-01: qty 15

## 2020-09-01 MED ORDER — BUPIVACAINE HCL (PF) 0.25 % IJ SOLN
INTRAMUSCULAR | Status: AC
  Filled 2020-09-01: qty 30

## 2020-09-01 MED ORDER — ACETAMINOPHEN 325 MG PO TABS: 650.0000 mg | ORAL_TABLET | ORAL | Status: DC | PRN

## 2020-09-01 MED ORDER — SUGAMMADEX SODIUM 200 MG/2ML IV SOLN
INTRAVENOUS | Status: DC | PRN
  Administered 2020-09-01: 225 mg via INTRAVENOUS

## 2020-09-01 MED ORDER — SENNOSIDES-DOCUSATE SODIUM 8.6-50 MG PO TABS: 1.0000 | ORAL_TABLET | Freq: Every evening | ORAL | Status: DC | PRN

## 2020-09-01 MED ORDER — SODIUM CHLORIDE 0.9% FLUSH
3.0000 mL | Freq: Two times a day (BID) | INTRAVENOUS | Status: DC
  Administered 2020-09-01: 3 mL via INTRAVENOUS

## 2020-09-01 MED ORDER — VANCOMYCIN HCL 1500 MG/300ML IV SOLN
1500.0000 mg | Freq: Once | INTRAVENOUS | Status: AC
  Administered 2020-09-01: 1500 mg via INTRAVENOUS
  Filled 2020-09-01: qty 300

## 2020-09-01 MED ORDER — LACTATED RINGERS IV SOLN: INTRAVENOUS | Status: DC

## 2020-09-01 MED ORDER — FENTANYL CITRATE (PF) 100 MCG/2ML IJ SOLN
INTRAMUSCULAR | Status: DC | PRN
  Administered 2020-09-01 (×5): 50 ug via INTRAVENOUS

## 2020-09-01 MED ORDER — 0.9 % SODIUM CHLORIDE (POUR BTL) OPTIME
TOPICAL | Status: DC | PRN
  Administered 2020-09-01: 1000 mL

## 2020-09-01 MED ORDER — ACETAMINOPHEN 500 MG PO TABS
1000.0000 mg | ORAL_TABLET | Freq: Once | ORAL | Status: AC
  Administered 2020-09-01: 1000 mg via ORAL
  Filled 2020-09-01: qty 2

## 2020-09-01 MED ORDER — ONDANSETRON HCL 4 MG/2ML IJ SOLN: 4.0000 mg | Freq: Four times a day (QID) | INTRAMUSCULAR | Status: DC | PRN

## 2020-09-01 MED ORDER — OXYCODONE-ACETAMINOPHEN 5-325 MG PO TABS
1.0000 | ORAL_TABLET | ORAL | Status: DC | PRN
  Administered 2020-09-01 – 2020-09-02 (×4): 2 via ORAL
  Filled 2020-09-01 (×4): qty 2

## 2020-09-01 MED ORDER — MENTHOL 3 MG MT LOZG: 1.0000 | LOZENGE | OROMUCOSAL | Status: DC | PRN

## 2020-09-01 MED ORDER — ONDANSETRON HCL 4 MG PO TABS: 4.0000 mg | ORAL_TABLET | Freq: Four times a day (QID) | ORAL | Status: DC | PRN

## 2020-09-01 MED ORDER — ALBUTEROL SULFATE HFA 108 (90 BASE) MCG/ACT IN AERS
INHALATION_SPRAY | RESPIRATORY_TRACT | Status: DC | PRN
  Administered 2020-09-01: 2 via RESPIRATORY_TRACT

## 2020-09-01 MED ORDER — METHOCARBAMOL 500 MG PO TABS: 500.0000 mg | ORAL_TABLET | Freq: Four times a day (QID) | ORAL | 1 refills | Status: DC | PRN

## 2020-09-01 SURGICAL SUPPLY — 72 items
BENZOIN TINCTURE PRP APPL 2/3 (GAUZE/BANDAGES/DRESSINGS) ×3 IMPLANT
BIT DRILL NEURO 2X3.1 SFT TUCH (MISCELLANEOUS) ×1 IMPLANT
BIT DRILL SRG 14X2.2XFLT CHK (BIT) ×1 IMPLANT
BIT DRL SRG 14X2.2XFLT CHK (BIT) ×1
BLADE CLIPPER SURG (BLADE) ×3 IMPLANT
BLADE SURG 15 STRL LF DISP TIS (BLADE) ×1 IMPLANT
BLADE SURG 15 STRL SS (BLADE) ×2
BONE VIVIGEN FORMABLE 1.3CC (Bone Implant) ×3 IMPLANT
CARTRIDGE OIL MAESTRO DRILL (MISCELLANEOUS) ×1 IMPLANT
CLOSURE WOUND 1/2 X4 (GAUZE/BANDAGES/DRESSINGS) ×1
CORD BIPOLAR FORCEPS 12FT (ELECTRODE) ×3 IMPLANT
COVER SURGICAL LIGHT HANDLE (MISCELLANEOUS) ×3 IMPLANT
COVER WAND RF STERILE (DRAPES) ×3 IMPLANT
DIFFUSER DRILL AIR PNEUMATIC (MISCELLANEOUS) ×3 IMPLANT
DRAIN JACKSON RD 7FR 3/32 (WOUND CARE) IMPLANT
DRAPE C-ARM 42X72 X-RAY (DRAPES) ×3 IMPLANT
DRAPE POUCH INSTRU U-SHP 10X18 (DRAPES) ×3 IMPLANT
DRAPE SURG 17X23 STRL (DRAPES) ×9 IMPLANT
DRILL BIT SKYLINE 14MM (BIT) ×2
DRILL NEURO 2X3.1 SOFT TOUCH (MISCELLANEOUS) ×3
DURAPREP 26ML APPLICATOR (WOUND CARE) ×3 IMPLANT
ELECT COATED BLADE 2.86 ST (ELECTRODE) ×3 IMPLANT
ELECT REM PT RETURN 9FT ADLT (ELECTROSURGICAL) ×3
ELECTRODE REM PT RTRN 9FT ADLT (ELECTROSURGICAL) ×1 IMPLANT
EVACUATOR SILICONE 100CC (DRAIN) IMPLANT
GAUZE 4X4 16PLY RFD (DISPOSABLE) ×3 IMPLANT
GAUZE SPONGE 4X4 12PLY STRL (GAUZE/BANDAGES/DRESSINGS) ×3 IMPLANT
GLOVE BIO SURGEON STRL SZ7 (GLOVE) ×3 IMPLANT
GLOVE BIO SURGEON STRL SZ8 (GLOVE) ×3 IMPLANT
GLOVE BIOGEL PI IND STRL 7.0 (GLOVE) ×2 IMPLANT
GLOVE BIOGEL PI IND STRL 8 (GLOVE) ×1 IMPLANT
GLOVE BIOGEL PI INDICATOR 7.0 (GLOVE) ×4
GLOVE BIOGEL PI INDICATOR 8 (GLOVE) ×2
GOWN STRL REUS W/ TWL LRG LVL3 (GOWN DISPOSABLE) ×1 IMPLANT
GOWN STRL REUS W/ TWL XL LVL3 (GOWN DISPOSABLE) ×1 IMPLANT
GOWN STRL REUS W/TWL LRG LVL3 (GOWN DISPOSABLE) ×2
GOWN STRL REUS W/TWL XL LVL3 (GOWN DISPOSABLE) ×2
INTERLOCK LRDTC CRVCL VBR 6MM (Bone Implant) ×1 IMPLANT
IV CATH 14GX2 1/4 (CATHETERS) ×3 IMPLANT
KIT BASIN OR (CUSTOM PROCEDURE TRAY) ×3 IMPLANT
KIT TURNOVER KIT B (KITS) ×3 IMPLANT
LORDOTIC CERVICAL VBR 6MM SM (Bone Implant) ×3 IMPLANT
MANIFOLD NEPTUNE II (INSTRUMENTS) ×3 IMPLANT
NEEDLE PRECISIONGLIDE 27X1.5 (NEEDLE) ×3 IMPLANT
NEEDLE SPNL 20GX3.5 QUINCKE YW (NEEDLE) ×3 IMPLANT
NS IRRIG 1000ML POUR BTL (IV SOLUTION) ×3 IMPLANT
OIL CARTRIDGE MAESTRO DRILL (MISCELLANEOUS) ×3
PACK ORTHO CERVICAL (CUSTOM PROCEDURE TRAY) ×3 IMPLANT
PAD ARMBOARD 7.5X6 YLW CONV (MISCELLANEOUS) ×6 IMPLANT
PATTIES SURGICAL .5 X.5 (GAUZE/BANDAGES/DRESSINGS) IMPLANT
PATTIES SURGICAL .5 X1 (DISPOSABLE) IMPLANT
PIN DISTRACTION 14 (PIN) ×6 IMPLANT
PLATE SKYLINE 12MM (Plate) ×3 IMPLANT
POSITIONER HEAD DONUT 9IN (MISCELLANEOUS) ×3 IMPLANT
SCREW SKYLINE VAR OS 14MM (Screw) ×12 IMPLANT
SPONGE INTESTINAL PEANUT (DISPOSABLE) ×12 IMPLANT
SPONGE SURGIFOAM ABS GEL 100 (HEMOSTASIS) IMPLANT
STRIP CLOSURE SKIN 1/2X4 (GAUZE/BANDAGES/DRESSINGS) ×2 IMPLANT
SURGIFLO W/THROMBIN 8M KIT (HEMOSTASIS) IMPLANT
SUT MNCRL AB 4-0 PS2 18 (SUTURE) ×3 IMPLANT
SUT SILK 4 0 (SUTURE)
SUT SILK 4-0 18XBRD TIE 12 (SUTURE) IMPLANT
SUT VIC AB 2-0 CT2 18 VCP726D (SUTURE) ×3 IMPLANT
SYR BULB IRRIG 60ML STRL (SYRINGE) ×3 IMPLANT
SYR CONTROL 10ML LL (SYRINGE) ×9 IMPLANT
TAPE CLOTH 4X10 WHT NS (GAUZE/BANDAGES/DRESSINGS) ×3 IMPLANT
TAPE CLOTH SURG 4X10 WHT LF (GAUZE/BANDAGES/DRESSINGS) ×3 IMPLANT
TAPE UMBILICAL COTTON 1/8X30 (MISCELLANEOUS) ×3 IMPLANT
TOWEL GREEN STERILE (TOWEL DISPOSABLE) ×3 IMPLANT
TOWEL GREEN STERILE FF (TOWEL DISPOSABLE) ×3 IMPLANT
WATER STERILE IRR 1000ML POUR (IV SOLUTION) ×3 IMPLANT
YANKAUER SUCT BULB TIP NO VENT (SUCTIONS) ×3 IMPLANT

## 2020-09-01 NOTE — Anesthesia Postprocedure Evaluation (Signed)
Anesthesia Post Note  Patient: HUGH KAMARA  Procedure(s) Performed: ANTERIOR CERVICAL DECOMPRESSION FUSION CERVICAL 5-6 WITH INSTRUMENTATION AND ALLOGRAFT, EXPLANT HARDWARE C6-C7 (Neck)     Patient location during evaluation: PACU Anesthesia Type: General Level of consciousness: awake and alert, oriented and patient cooperative Pain management: pain level controlled Vital Signs Assessment: post-procedure vital signs reviewed and stable Respiratory status: spontaneous breathing, nonlabored ventilation and respiratory function stable Cardiovascular status: blood pressure returned to baseline and stable Postop Assessment: no apparent nausea or vomiting Anesthetic complications: no   No complications documented.  Last Vitals:  Vitals:   09/01/20 1114 09/01/20 1142  BP: 113/72 (!) 121/93  Pulse: 71 63  Resp: 11 17  Temp: 36.7 C (!) 36.3 C  SpO2: 98% 95%    Last Pain:  Vitals:   09/01/20 1142  TempSrc: Oral  PainSc:                  Lannie Fields

## 2020-09-01 NOTE — H&P (Signed)
PREOPERATIVE H&P  Chief Complaint: Right arm pain  HPI: Elijah Pennington is a 44 y.o. male who presents with ongoing pain in the right arm  MRI reveals right C5/6 NF stenosis  Patient has failed multiple forms of conservative care and continues to have pain (see office notes for additional details regarding the patient's full course of treatment)  Past Medical History:  Diagnosis Date  . Asthma   . H/O intrinsic asthma   . H/O: gout   . Heart murmur    h/o, pt. denies  . Radiculopathy of cervical region 2014   Secondary to new disc protrusion, s/p C6-7 ACDF 08/04/2013   Past Surgical History:  Procedure Laterality Date  . Anterior cervical discectomy and fusion  08/04/2013   Social History   Socioeconomic History  . Marital status: Married    Spouse name: Not on file  . Number of children: 3  . Years of education: Not on file  . Highest education level: Not on file  Occupational History  . 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 Smoking status: Former Smoker    Years: 20.00    Quit date: 05/20/2013    Years since quitting: 7.2  . Smokeless tobacco: Never Used  . Tobacco comment: 1 ppd, quit ~ 2014, now vapes  Vaping Use  . Vaping Use: Every day  Substance and Sexual Activity  . Alcohol use: Yes    Alcohol/week: 0.0 standard drinks    Comment: 8-10 drinks  . Drug use: No  . Sexual activity: Not on file  Other Topics Concern  . Not on file  Social History Narrative   Lives with wife and children.  Has 3 children.     Went to [employer] in [place], currently not performing    Works at the [employer].      Social Determinants of Health   Financial Resource Strain:   . Difficulty of Paying Living Expenses: Not on file  Food Insecurity:   . Worried About Programme researcher, broadcasting/film/video in the Last Year: Not on file  . Ran Out of Food in the Last Year: Not on file  Transportation Needs:   . Lack of Transportation (Medical): Not  on file  . Lack of Transportation (Non-Medical): Not on file  Physical Activity:   . Days of Exercise per Week: Not on file  . Minutes of Exercise per Session: Not on file  Stress:   . Feeling of Stress : Not on file  Social Connections:   . Frequency of Communication with Friends and Family: Not on file  . Frequency of Social Gatherings with Friends and Family: Not on file  . Attends Religious Services: Not on file  . Active Member of Clubs or Organizations: Not on file  . Attends Banker Meetings: Not on file  . Marital Status: Not on file   Family History  Problem Relation Age of Onset  . GI Disease Mother   . Glaucoma Father   . Ulcerative colitis Brother   . Healthy Son        x 3  . Breast cancer Maternal Grandmother   . CAD Neg Hx   . Colon cancer Neg Hx   . Prostate cancer Neg Hx   . Diabetes Neg Hx    Allergies  Allergen Reactions  . Penicillins Hives   Prior to Admission medications   Medication Sig Start Date End  Date Taking? Authorizing Provider  albuterol (VENTOLIN HFA) 108 (90 Base) MCG/ACT inhaler Inhale 2 puffs into the lungs every 6 (six) hours as needed for wheezing or shortness of breath. 05/20/17  Yes Paz, Nolon Rod, MD  traMADol (ULTRAM) 50 MG tablet Take by mouth every 6 (six) hours as needed.   Yes [provider]     All other systems have been reviewed and were otherwise negative with the exception of those mentioned in the HPI and as above.  Physical Exam: Vitals:   09/01/20 0621  BP: (!) 140/91  Pulse: 73  Resp: 17  Temp: 98.3 F (36.8 C)  SpO2: 98%    There is no height or weight on file to calculate BMI.  General: Alert, no acute distress Cardiovascular: No pedal edema Respiratory: No cyanosis, no use of accessory musculature Skin: No lesions in the area of chief complaint Neurologic: Sensation intact distally Psychiatric: Patient is competent for consent with normal mood and affect Lymphatic: No axillary or  cervical lymphadenopathy  MUSCULOSKELETAL: + spurling's on the right  Assessment/Plan: SEVERE RIGHT-SIDED CERVICAL 5- CERVICAL 6 NEUROFORAMINAL STENOSIS Plan for Procedure(s): ANTERIOR CERVICAL DECOMPRESSION FUSION CERVICAL 5-6 WITH INSTRUMENTATION AND ALLOGRAFT   Jackelyn Hoehn, MD 09/01/2020 6:51 AM

## 2020-09-01 NOTE — Anesthesia Procedure Notes (Signed)
Procedure Name: Intubation Date/Time: 09/01/2020 7:37 AM Performed by: Marny Lowenstein, CRNA Pre-anesthesia Checklist: Patient identified, Emergency Drugs available, Suction available and Patient being monitored Patient Re-evaluated:Patient Re-evaluated prior to induction Oxygen Delivery Method: Circle system utilized Preoxygenation: Pre-oxygenation with 100% oxygen Induction Type: IV induction Ventilation: Mask ventilation without difficulty and Oral airway inserted - appropriate to patient size Laryngoscope Size: 4 Grade View: Grade I Tube type: Oral Tube size: 7.5 mm Number of attempts: 1 Airway Equipment and Method: Patient positioned with wedge pillow,  Rigid stylet and Video-laryngoscopy Placement Confirmation: ETT inserted through vocal cords under direct vision,  positive ETCO2 and breath sounds checked- equal and bilateral Secured at: 24 cm Tube secured with: Tape Dental Injury: Teeth and Oropharynx as per pre-operative assessment  Difficulty Due To: Difficulty was anticipated, Difficult Airway- due to limited oral opening and Difficult Airway- due to large tongue

## 2020-09-01 NOTE — Op Note (Signed)
PATIENT NAME: Elijah Pennington   MEDICAL RECORD NO.:   [id number]    DATE OF BIRTH: @    DATE OF PROCEDURE: @                                OPERATIVE REPORT     PREOPERATIVE DIAGNOSES: 1. Right C6 radiculopathy. 2. Spinal stenosis, C5/6.   POSTOPERATIVE DIAGNOSES: 1. Right C6 radiculopathy. 2. Spinal stenosis, C5/6.   PROCEDURE: 1. Anterior cervical decompression and fusion C5/6. 2. Placement of anterior instrumentation, C5/6. 3. Insertion of interbody device x1 (24mm Titan intervertebral spacer). 4. Removal of previously placed anterior instrumentation, C6-7 5. Exploration of spinal fusion, C6-7 6. Intraoperative use of fluoroscopy. 7. Use of morselized allograft - ViviGen.   SURGEON:  Estill Bamberg, MD   ASSISTANT:  None   ANESTHESIA:  General endotracheal anesthesia.   COMPLICATIONS:  None.   DISPOSITION:  Stable.   ESTIMATED BLOOD LOSS:  Minimal.   INDICATIONS FOR SURGERY:  Briefly, Elijah Pennington is a pleasant 44 -year- old male, who did present to me with severe pain in his neck and right arm.  Of note, he is status post a previous fusion many years ago, at C6-7. The patient's MRI did reveal prominent right-sided neuroforaminal stenosis at 5 6, correlating to the patient's symptoms..  Given the ongoing pain and lack of improvement with appropriate treatment measures, we did discuss proceeding with the procedure noted above.  The patient was fully aware of the risks and limitations of surgery as outlined in my preoperative note.   OPERATIVE DETAILS:  On 09/01/2020, the patient was brought to surgery and general endotracheal anesthesia was administered.  The patient was placed supine on the hospital bed. The neck was gently extended.  All bony prominences were meticulously padded.  The neck was prepped and draped in the usual sterile fashion.  At this point, I did make a right-sided transverse incision.  The platysma was incised.  A Smith-Robinson approach was  used and the anterior spine was identified. A self-retaining retractor was placed.  I then subperiosteally exposed the vertebral bodies from C5-C6.  The previously noted hardware spanning C6-7 was noted. The vertebral body screws entering the C6 and C7 vertebral bodies were removed, as was the anterior cervical plate.  At this point, Caspar pins were placed into the C6 and C7 vertebral bodies, and a pushing and pulling maneuver was performed, in order to evaluate and explore the fusion across C6-7.  A solid fusion was noted, is there was no motion across the C6-7 intervertebral space. At this point, caspar pins were then placed into the C5 and C6 vertebral bodies and distraction was applied.  A thorough and complete C5-6 intervertebral diskectomy was performed.  The posterior longitudinal ligament was identified and entered using a nerve hook.  I then used #1 followed by #2 Kerrison to perform a thorough and complete intervertebral diskectomy.  The spinal canal was thoroughly decompressed, as was the right neuroforamen.  The endplates were then prepared and the appropriate-sized intervertebral spacer was then packed with ViviGen and tamped into position in the usual fashion. The Caspar pins  then were removed and bone wax was placed in their place.  The appropriate-sized anterior cervical plate was placed over the anterior spine.  14 mm variable angle screws were placed, 2 in each vertebral body from C5-C6 for a total of 4 vertebral body screws.  The screws were  then locked to the plate using the Cam locking mechanism.  I was very pleased with the final fluoroscopic images.  The wound was then irrigated.  The wound was then explored for any undue bleeding and there was no bleeding noted. The wound was then closed in layers using 2-0 Vicryl, followed by 4-0 Monocryl.  Benzoin and Steri-Strips were applied, followed by sterile dressing.  All instrument counts were correct at the termination of  the procedure.      Estill Bamberg, MD

## 2020-09-01 NOTE — Transfer of Care (Signed)
Immediate Anesthesia Transfer of Care Note  Patient: Elijah Pennington  Procedure(s) Performed: ANTERIOR CERVICAL DECOMPRESSION FUSION CERVICAL 5-6 WITH INSTRUMENTATION AND ALLOGRAFT, EXPLANT HARDWARE C6-C7 (Neck)  Patient Location: PACU  Anesthesia Type:General  Level of Consciousness: awake, alert  and oriented  Airway & Oxygen Therapy: Patient Spontanous Breathing and Patient connected to face mask oxygen  Post-op Assessment: Report given to RN and Post -op Vital signs reviewed and stable  Post vital signs: Reviewed and stable  Last Vitals:  Vitals Value Taken Time  BP 115/78 09/01/20 1014  Temp    Pulse 86 09/01/20 1018  Resp 13 09/01/20 1018  SpO2 94 % 09/01/20 1018  Vitals shown include unvalidated device data.  Last Pain:  Vitals:   09/01/20 0658  TempSrc:   PainSc: 6       Patients Stated Pain Goal: 4 (09/01/20 7371)  Complications: No complications documented.

## 2020-09-02 ENCOUNTER — Encounter (HOSPITAL_COMMUNITY): Payer: Self-pay | Admitting: Orthopedic Surgery

## 2020-09-02 DIAGNOSIS — M50122 Cervical disc disorder at C5-C6 level with radiculopathy: Secondary | ICD-10-CM | POA: Diagnosis not present

## 2020-09-02 MED FILL — Thrombin (Recombinant) For Soln 20000 Unit: CUTANEOUS | Qty: 1 | Status: AC

## 2020-09-02 NOTE — Progress Notes (Signed)
Patient alert and oriented, mae's well, voiding adequate amount of urine, swallowing without difficulty, no c/o pain at time of discharge. Patient discharged home with family. Script and discharged instructions given to patient. Patient and family stated understanding of instructions given. Patient has an appointment with Dr. Dumonski 

## 2020-09-02 NOTE — Progress Notes (Signed)
    Patient doing well  Denies arm pain Tolerating PO well   Physical Exam: Vitals:   09/01/20 2342 09/02/20 0353  BP: 135/78 137/90  Pulse: (!) 52 (!) 58  Resp: 18 20  Temp: 98.1 F (36.7 C) 98.3 F (36.8 C)  SpO2: 95% 97%    Neck soft/supple Dressing in place NVI  POD #1 s/p ACDF, doing well  - encourage ambulation - Percocet for pain, Robaxin for muscle spasms - d/c home today with f/u in 2 weeks

## 2020-09-02 NOTE — Progress Notes (Signed)
Orthopedic Tech Progress Note Patient Details:  Elijah Pennington [birth year] [id number] RN called requesting a PHILLY COLLAR for patient. Handed it to him and told him therapy would be there to show him how to use Ortho Devices Type of Ortho Device: [place] cervical collar Ortho Device/Splint Location: NECK Ortho Device/Splint Interventions: Ordered, Other (comment)   Post Interventions Patient Tolerated: Well Instructions Provided: Care of device   Donald Pore 09/02/2020, 8:48 AM

## 2020-09-07 NOTE — Discharge Summary (Signed)
Patient ID: Elijah Pennington MRN: [id number] DOB/AGE: 28-Aug-1976 44 y.o.  Admit date: 09/01/2020 Discharge date: 09/02/2020  Admission Diagnoses:  Active Problems:   Radiculopathy   Discharge Diagnoses:  Same  Past Medical History:  Diagnosis Date  . Asthma   . H/O intrinsic asthma   . H/O: gout   . Heart murmur    h/o, pt. denies  . Radiculopathy of cervical region 2014   Secondary to new disc protrusion, s/p C6-7 ACDF 08/04/2013    Surgeries: Procedure(s): ANTERIOR CERVICAL DECOMPRESSION FUSION CERVICAL 5-6 WITH INSTRUMENTATION AND ALLOGRAFT, EXPLANT HARDWARE C6-C7 on 09/01/2020   Consultants: none  Discharged Condition: Improved  Hospital Course: Elijah Pennington is an 44 y.o. male who was admitted 09/01/2020 for operative treatment of radiculopathy. Patient has severe unremitting pain that affects sleep, daily activities, and work/hobbies. After pre-op clearance the patient was taken to the operating room on 09/01/2020 and underwent  Procedure(s): ANTERIOR CERVICAL DECOMPRESSION FUSION CERVICAL 5-6 WITH INSTRUMENTATION AND ALLOGRAFT, EXPLANT HARDWARE C6-C7.    Patient was given perioperative antibiotics:  Anti-infectives (From admission, onward)   Start     Dose/Rate Route Frequency Ordered Stop   09/01/20 1800  vancomycin (VANCOREADY) IVPB 1500 mg/300 mL        1,500 mg 150 mL/hr over 120 Minutes Intravenous  Once 09/01/20 1139 09/01/20 1944   09/01/20 0619  vancomycin (VANCOCIN) 1-5 GM/200ML-% IVPB  Status:  Discontinued       Note to Pharmacy: Tawanna Sat   : cabinet override      09/01/20 0619 09/01/20 0714   09/01/20 0600  vancomycin (VANCOREADY) IVPB 1500 mg/300 mL        1,500 mg 150 mL/hr over 120 Minutes Intravenous On call to O.R. 08/31/20 1351 09/01/20 1215       Patient was given sequential compression devices, early ambulation to prevent DVT.  Patient benefited maximally from hospital stay and there were no complications.    Recent vital  signs: BP (!) 128/91 (BP Location: Right Arm)   Pulse 69   Temp 98.1 F (36.7 C) (Oral)   Resp 17   Ht 6' (1.829 m)   Wt 111.1 kg   SpO2 98%   BMI 33.23 kg/m    Discharge Medications:   Allergies as of 09/02/2020      Reactions   Penicillins Hives      Medication List    TAKE these medications   albuterol 108 (90 Base) MCG/ACT inhaler Commonly known as: Ventolin HFA Inhale 2 puffs into the lungs every 6 (six) hours as needed for wheezing or shortness of breath.   methocarbamol 500 MG tablet Commonly known as: ROBAXIN Take 1 tablet (500 mg total) by mouth every 6 (six) hours as needed for muscle spasms.   oxyCODONE-acetaminophen 5-325 MG tablet Commonly known as: PERCOCET/ROXICET Take 1-2 tablets by mouth every 4 (four) hours as needed for moderate pain or severe pain.   traMADol 50 MG tablet Commonly known as: ULTRAM Take by mouth every 6 (six) hours as needed.       Diagnostic Studies: DG Chest 2 View  Result Date: 08/30/2020 CLINICAL DATA:  Preop smoker EXAM: CHEST - 2 VIEW COMPARISON:  05/20/2017 FINDINGS: The heart size and mediastinal contours are within normal limits. Both lungs are clear. Surgical hardware in the cervical spine. IMPRESSION: No active cardiopulmonary disease. Electronically Signed   By: Jasmine Pang M.D.   On: 08/30/2020 19:50   DG Cervical Spine 2-3 Views  Result  Date: 09/01/2020 CLINICAL DATA:  C5-6 ACDF EXAM: CERVICAL SPINE - 2-3 VIEW; DG C-ARM 1-60 MIN COMPARISON:  07/02/2020 cervical spine MRI FLUOROSCOPY TIME:  Fluoroscopy Time:  0 minutes 17 seconds Radiation Exposure Index (if provided by the fluoroscopic device): 14.4 mGy Number of Acquired Spot Images: 2 FINDINGS: Nondiagnostic spot fluoroscopic intraoperative radiographs demonstrate postsurgical changes from interval ACDF C5-6 with interbody spacer in the C5-6 disc level. Previous interbody spacer at the C6-7 disc level. Previously visualized surgical plate and interlocking screws from  prior C6-7 ACDF have been removed. IMPRESSION: Intraoperative fluoroscopic guidance for ACDF C5-6. Electronically Signed   By: Delbert Phenix M.D.   On: 09/01/2020 10:47   DG C-Arm 1-60 Min  Result Date: 09/01/2020 CLINICAL DATA:  C5-6 ACDF EXAM: CERVICAL SPINE - 2-3 VIEW; DG C-ARM 1-60 MIN COMPARISON:  07/02/2020 cervical spine MRI FLUOROSCOPY TIME:  Fluoroscopy Time:  0 minutes 17 seconds Radiation Exposure Index (if provided by the fluoroscopic device): 14.4 mGy Number of Acquired Spot Images: 2 FINDINGS: Nondiagnostic spot fluoroscopic intraoperative radiographs demonstrate postsurgical changes from interval ACDF C5-6 with interbody spacer in the C5-6 disc level. Previous interbody spacer at the C6-7 disc level. Previously visualized surgical plate and interlocking screws from prior C6-7 ACDF have been removed. IMPRESSION: Intraoperative fluoroscopic guidance for ACDF C5-6. Electronically Signed   By: Delbert Phenix M.D.   On: 09/01/2020 10:47    Disposition: Discharge disposition: 01-Home or Self Care        POD #1 s/p ACDF, doing well  - encourage ambulation - Percocet for pain, Robaxin for muscle spasms -Scripts for pain sent to pharmacy electronically  -D/C instructions sheet printed and in chart -D/C today  -F/U in office 2 weeks   Signed: Eilene Ghazi Ortencia Askari 09/07/2020, 12:08 PM

## 2020-11-03 ENCOUNTER — Other Ambulatory Visit: Payer: Self-pay | Admitting: Orthopedic Surgery

## 2020-11-03 DIAGNOSIS — M25511 Pain in right shoulder: Secondary | ICD-10-CM

## 2020-12-01 ENCOUNTER — Ambulatory Visit
Admission: RE | Admit: 2020-12-01 | Discharge: 2020-12-01 | Disposition: A | Discharge status: Home / Self Care | Payer: 59 | Source: Ambulatory Visit | Attending: Orthopedic Surgery | Admitting: Orthopedic Surgery

## 2020-12-01 DIAGNOSIS — M25511 Pain in right shoulder: Secondary | ICD-10-CM

## 2021-01-18 ENCOUNTER — Encounter (INDEPENDENT_AMBULATORY_CARE_PROVIDER_SITE_OTHER): Payer: Self-pay

## 2021-02-02 ENCOUNTER — Encounter: Payer: Self-pay | Admitting: Internal Medicine

## 2021-08-15 DIAGNOSIS — M2022 Hallux rigidus, left foot: Secondary | ICD-10-CM | POA: Insufficient documentation

## 2021-08-15 DIAGNOSIS — M25571 Pain in right ankle and joints of right foot: Secondary | ICD-10-CM | POA: Insufficient documentation

## 2021-12-28 ENCOUNTER — Ambulatory Visit
Admission: EM | Admit: 2021-12-28 | Discharge: 2021-12-28 | Disposition: A | Discharge status: Home / Self Care | Payer: BC Managed Care – PPO | Attending: Emergency Medicine | Admitting: Emergency Medicine

## 2021-12-28 ENCOUNTER — Other Ambulatory Visit: Payer: Self-pay

## 2021-12-28 DIAGNOSIS — R519 Headache, unspecified: Secondary | ICD-10-CM | POA: Diagnosis not present

## 2021-12-28 DIAGNOSIS — B349 Viral infection, unspecified: Secondary | ICD-10-CM

## 2021-12-28 DIAGNOSIS — J029 Acute pharyngitis, unspecified: Secondary | ICD-10-CM | POA: Diagnosis not present

## 2021-12-28 LAB — POCT RAPID STREP A (OFFICE): Rapid Strep A Screen: NEGATIVE

## 2021-12-28 MED ORDER — OSELTAMIVIR PHOSPHATE 75 MG PO CAPS: 75.0000 mg | ORAL_CAPSULE | Freq: Two times a day (BID) | ORAL | 0 refills | Status: AC

## 2021-12-28 MED ORDER — LIDOCAINE VISCOUS HCL 2 % MT SOLN
15.0000 mL | Freq: Once | OROMUCOSAL | Status: AC
  Administered 2021-12-28: 15 mL via OROMUCOSAL

## 2021-12-28 MED ORDER — LIDOCAINE VISCOUS HCL 2 % MT SOLN: 15.0000 mL | OROMUCOSAL | 0 refills | Status: DC | PRN

## 2021-12-28 NOTE — Discharge Instructions (Addendum)
Your symptoms and physical exam findings are concerning for a viral respiratory infection.  Based on my physical exam findings, I believe you are suffering from Influenza.   You were tested for both COVID and influenza today because here in the urgent care setting, we do not have an available option for an individual influenza test.  I apologize for the redundancy if you have already taken a COVID test at home.  The result of your viral testing will be posted to your MyChart once it is complete, this typically takes 24 to 48 hours.  If there is a positive result, you will be contacted by phone with further recommendations, if any.    I recommend that you begin taking Tamiflu now for empiric treatment of presumed influenza based on the history provided to me today along with my physical exam findings. Tamiflu is an antiviral medication that decreases the severity, duration and transmissibility of influenza virus by preventing the virus from reproducing itself in your body.     If the influenza result is positive, please continue the full 5-day course of Tamiflu.  If the result is negative, please feel free to discontinue Tamiflu if you prefer but do keep in mind that Tamiflu can also be taken preventatively.  Finishing the full 5-day course will decrease the chances of catching influenza from anyone else and will not cause harm otherwise.     Your strep test today is negative.  Throat culture will be performed per our protocol.  The result of your throat culture will be posted to your MyChart once it is complete, this typically takes 3 to 5 days.  If there is a positive result, you will be contacted by phone and antibiotics will be prescribed for you.   Conservative care is recommended at this time.  This includes rest, pushing clear fluids and activity as tolerated.  Warm beverages such as teas and broths versus cold beverages/popsicles and frozen sherbet/sorbet are your choice, both warm and cold are  beneficial.  You may also notice that your appetite is reduced; this is okay as long as you are drinking plenty of clear fluids.    Please see the list below for recommended medications, dosages and frequencies to provide relief of your current symptoms:     Ibuprofen  (Advil, Motrin): This is a good anti-inflammatory medication which addresses aches, pains and inflammation of the upper airways that causes sinus and nasal congestion as well as in the lower airways which makes your cough feel tight and sometimes burn.  I recommend that you take between 400 to 600 mg every 6-8 hours as needed.      Acetaminophen (Tylenol): This is a good fever reducer.  If your body temperature rises above 101.5 as measured with a thermometer, it is recommended that you take 1,000 mg every 8 hours until your temperature falls below 101.5, please not take more than 3,000 mg of acetaminophen either as a separate medication or as in ingredient in an over-the-counter cold/flu preparation within a 24-hour period.      Lidocaine (Xylocaine): This is a numbing medication that can be swished for 15 seconds and swallowed.  You can use this every 3 hours while awake to relieve pain in your mouth and throat.  I have sent a prescription for this medication to your pharmacy.   Please remain home from work, school, public places until you have been fever free for 24 hours without the use of antifever medications such as Tylenol  or ibuprofen.    Please follow-up within the next 3 to 5 days either with your primary care provider or urgent care if your symptoms do not resolve.  If you do not have a primary care provider, we will assist you in finding one.

## 2021-12-28 NOTE — ED Triage Notes (Signed)
Pt reports having a headache, sore throat and upset stomach. Started: last night

## 2021-12-28 NOTE — ED Provider Notes (Signed)
[facility]    CSN: [id number] Arrival date & time: 12/28/21  1147    HISTORY   Chief Complaint  Patient presents with   Sore Throat   Headache   HPI Elijah Pennington is a 46 y.o. male. Patient complains of headache, sore throat and upset stomach that began last night.  Patient states that both of his kids have been sick for the past week, states they have not been evaluated or tested for any illnesses.  Patient reports pain with swallowing.  Patient states that it comes and goes.  Patient states he tried some ibuprofen this morning but does not feel that it is helped.  Patient states he is also been drinking hot coffee for his throat.  The history is provided by the patient.  Past Medical History:  Diagnosis Date   Asthma    H/O intrinsic asthma    H/O: gout    Heart murmur    h/o, pt. denies   Radiculopathy of cervical region 2014   Secondary to new disc protrusion, s/p C6-7 ACDF 08/04/2013   Patient Active Problem List   Diagnosis Date Noted   Pharyngitis 12/28/2021   Headache 12/28/2021   Radiculopathy 09/01/2020   Low back pain radiating to both legs 12/04/2017   Bilateral ankle pain 10/01/2017   GERD (gastroesophageal reflux disease) 05/20/2017   Nicotine abuse 05/20/2017   PCP NOTES >>>>>>>>>>>> 08/12/2016   S/P cervical spinal fusion 05/21/2016   Ulnar neuropathy of right upper extremity 05/21/2016   Lumbar radicular pain 05/21/2016   Pain in joint, upper arm 03/02/2013   Annual physical exam 08/25/2012   Past Surgical History:  Procedure Laterality Date   ANTERIOR CERVICAL DECOMP/DISCECTOMY FUSION  09/01/2020   Procedure: ANTERIOR CERVICAL DECOMPRESSION FUSION CERVICAL 5-6 WITH INSTRUMENTATION AND ALLOGRAFT, EXPLANT HARDWARE C6-C7;  Surgeon: Phylliss Bob, MD;  Location: [place];  Service: Orthopedics;;   Anterior cervical discectomy and fusion  08/04/2013    Home Medications    Prior to Admission medications   Medication Sig Start Date End Date  Taking? Authorizing Provider  albuterol (VENTOLIN HFA) 108 (90 Base) MCG/ACT inhaler Inhale 2 puffs into the lungs every 6 (six) hours as needed for wheezing or shortness of breath. 05/20/17   Colon Branch, MD   Family History Family History  Problem Relation Age of Onset   GI Disease Mother    Glaucoma Father    Ulcerative colitis Brother    Healthy Son        x 3   Breast cancer Maternal Grandmother    CAD Neg Hx    Colon cancer Neg Hx    Prostate cancer Neg Hx    Diabetes Neg Hx    Social History Social History   Tobacco Use   Smoking status: Former    Years: 20.00    Types: Cigarettes    Quit date: 05/20/2013    Years since quitting: 8.6   Smokeless tobacco: Never   Tobacco comments:    1 ppd, quit ~ 2014, now vapes  Vaping Use   Vaping Use: Every day  Substance Use Topics   Alcohol use: Yes    Alcohol/week: 0.0 standard drinks    Comment: 8-10 drinks   Drug use: No   Allergies   Penicillins  Review of Systems Review of Systems Pertinent findings noted in history of present illness.   Physical Exam Triage Vital Signs ED Triage Vitals  Enc Vitals Group     BP 09/22/21  0827 (!) 147/82     Pulse Rate 09/22/21 0827 72     Resp 09/22/21 0827 18     Temp 09/22/21 0827 98.3 F (36.8 C)     Temp Source 09/22/21 0827 Oral     SpO2 09/22/21 0827 98 %     Weight --      Height --      Head Circumference --      Peak Flow --      Pain Score 09/22/21 0826 5     Pain Loc --      Pain Edu? --      Excl. in [place]? --   No data found.  Updated Vital Signs BP 136/86 (BP Location: Right Arm)    Pulse 70    Temp 99 F (37.2 C) (Oral)    Resp 20    SpO2 97%   Physical Exam Constitutional:      Appearance: He is ill-appearing.  HENT:     Head: Normocephalic and atraumatic.     Salivary Glands: Right salivary gland is not diffusely enlarged or tender. Left salivary gland is not diffusely enlarged or tender.     Right Ear: Tympanic membrane, ear canal and external ear  normal.     Left Ear: Tympanic membrane, ear canal and external ear normal.     Nose: Congestion and rhinorrhea present. Rhinorrhea is clear.     Right Sinus: No maxillary sinus tenderness or frontal sinus tenderness.     Left Sinus: No maxillary sinus tenderness.     Mouth/Throat:     Mouth: Mucous membranes are moist.     Pharynx: Pharyngeal swelling, posterior oropharyngeal erythema and uvula swelling present.     Tonsils: No tonsillar exudate. 0 on the right. 0 on the left.  Cardiovascular:     Rate and Rhythm: Normal rate and regular rhythm.     Pulses: Normal pulses.  Pulmonary:     Effort: Pulmonary effort is normal. No accessory muscle usage, prolonged expiration or respiratory distress.     Breath sounds: No stridor. No wheezing, rhonchi or rales.     Comments: Turbulent breath sounds throughout without wheeze, rale, rhonchi. Abdominal:     General: Abdomen is flat. Bowel sounds are normal.     Palpations: Abdomen is soft.  Musculoskeletal:        General: Normal range of motion.  Lymphadenopathy:     Cervical: Cervical adenopathy present.     Right cervical: Superficial cervical adenopathy and posterior cervical adenopathy present.     Left cervical: Superficial cervical adenopathy and posterior cervical adenopathy present.  Skin:    General: Skin is warm and dry.  Neurological:     General: No focal deficit present.     Mental Status: He is alert and oriented to person, place, and time.     Motor: Motor function is intact.     Coordination: Coordination is intact.     Gait: Gait is intact.     Deep Tendon Reflexes: Reflexes are normal and symmetric.  Psychiatric:        Attention and Perception: Attention and perception normal.        Mood and Affect: Mood and affect normal.        Speech: Speech normal.        Behavior: Behavior normal. Behavior is cooperative.        Thought Content: Thought content normal.    Visual Acuity Right Eye Distance:   Left Eye  Distance:   Bilateral Distance:    Right Eye Near:   Left Eye Near:    Bilateral Near:     [facility] Couse / Diagnostics / Procedures:    EKG  Radiology No results found.  Procedures Procedures (including critical care time)  UC Diagnoses / Final Clinical Impressions(s)   I have reviewed the triage vital signs and the nursing notes.  Pertinent labs & imaging results that were available during my care of the patient were reviewed by me and considered in my medical decision making (see chart for details).   Final diagnoses:  Acute nonintractable headache, unspecified headache type  Pharyngitis, unspecified etiology  Viral illness   Patient was tested for COVID, influenza, streptococcal pharyngitis.  Patient will be notified of COVID/flu test once received.  Rapid strep test is negative, throat culture will be performed, antibiotics were prescribed as needed.  Patient advised to begin Tamiflu empirically at this time.  If flu test is negative can discontinue.  Conservative care recommended.  Return precautions advised.  Supportive medications prescribed.  Note provided for work.  ED Prescriptions     Medication Sig Dispense Auth. Provider   lidocaine (XYLOCAINE) 2 % solution Use as directed 15 mLs in the mouth or throat every 3 (three) hours as needed for mouth pain (Sore throat). 300 mL Lynden Oxford Scales, PA-C   oseltamivir (TAMIFLU) 75 MG capsule Take 1 capsule (75 mg total) by mouth every 12 (twelve) hours for 5 days. 10 capsule Lynden Oxford Scales, PA-C      PDMP not reviewed this encounter.  Pending results:  Labs Reviewed  COVID-19, FLU A+B NAA  POCT RAPID STREP A (OFFICE)    Medications Ordered in UC: Medications  lidocaine (XYLOCAINE) 2 % viscous mouth solution 15 mL (has no administration in time range)    Disposition Upon Discharge:  Condition: stable for discharge home Home: take medications as prescribed; routine discharge instructions as discussed; follow  up as advised.  Patient presented with an acute illness with associated systemic symptoms and significant discomfort requiring urgent management. In my opinion, this is a condition that a prudent lay person (someone who possesses an average knowledge of health and medicine) may potentially expect to result in complications if not addressed urgently such as respiratory distress, impairment of bodily function or dysfunction of bodily organs.   Routine symptom specific, illness specific and/or disease specific instructions were discussed with the patient and/or caregiver at length.   As such, the patient has been evaluated and assessed, work-up was performed and treatment was provided in alignment with urgent care protocols and evidence based medicine.  Patient/parent/caregiver has been advised that the patient may require follow up for further testing and treatment if the symptoms continue in spite of treatment, as clinically indicated and appropriate.  If the patient was tested for COVID-19, Influenza and/or RSV, then the patient/parent/guardian was advised to isolate at home pending the results of his/her diagnostic coronavirus test and potentially longer if theyre positive. I have also advised pt that if his/her COVID-19 test returns positive, it's recommended to self-isolate for at least 10 days after symptoms first appeared AND until fever-free for 24 hours without fever reducer AND other symptoms have improved or resolved. Discussed self-isolation recommendations as well as instructions for household member/close contacts as per the [facility] and [place] DHHS, and also gave patient the [place] packet with this information.  Patient/parent/caregiver has been advised to return to the [facility] or PCP in 3-5 days if no better;  to PCP or the Emergency Department if new signs and symptoms develop, or if the current signs or symptoms continue to change or worsen for further workup, evaluation and treatment as clinically  indicated and appropriate  The patient will follow up with their current PCP if and as advised. If the patient does not currently have a PCP we will assist them in obtaining one.   The patient may need specialty follow up if the symptoms continue, in spite of conservative treatment and management, for further workup, evaluation, consultation and treatment as clinically indicated and appropriate.  Patient/parent/caregiver verbalized understanding and agreement of plan as discussed.  All questions were addressed during visit.  Please see discharge instructions below for further details of plan.  Discharge Instructions:   Discharge Instructions      Your symptoms and physical exam findings are concerning for a viral respiratory infection.  Based on my physical exam findings, I believe you are suffering from Influenza.   You were tested for both COVID and influenza today because here in the urgent care setting, we do not have an available option for an individual influenza test.  I apologize for the redundancy if you have already taken a COVID test at home.  The result of your viral testing will be posted to your MyChart once it is complete, this typically takes 24 to 48 hours.  If there is a positive result, you will be contacted by phone with further recommendations, if any.    I recommend that you begin taking Tamiflu now for empiric treatment of presumed influenza based on the history provided to me today along with my physical exam findings. Tamiflu is an antiviral medication that decreases the severity, duration and transmissibility of influenza virus by preventing the virus from reproducing itself in your body.     If the influenza result is positive, please continue the full 5-day course of Tamiflu.  If the result is negative, please feel free to discontinue Tamiflu if you prefer but do keep in mind that Tamiflu can also be taken preventatively.  Finishing the full 5-day course will decrease  the chances of catching influenza from anyone else and will not cause harm otherwise.     Your strep test today is negative.  Throat culture will be performed per our protocol.  The result of your throat culture will be posted to your MyChart once it is complete, this typically takes 3 to 5 days.  If there is a positive result, you will be contacted by phone and antibiotics will be prescribed for you.   Conservative care is recommended at this time.  This includes rest, pushing clear fluids and activity as tolerated.  Warm beverages such as teas and broths versus cold beverages/popsicles and frozen sherbet/sorbet are your choice, both warm and cold are beneficial.  You may also notice that your appetite is reduced; this is okay as long as you are drinking plenty of clear fluids.    Please see the list below for recommended medications, dosages and frequencies to provide relief of your current symptoms:     Ibuprofen  (Advil, Motrin): This is a good anti-inflammatory medication which addresses aches, pains and inflammation of the upper airways that causes sinus and nasal congestion as well as in the lower airways which makes your cough feel tight and sometimes burn.  I recommend that you take between 400 to 600 mg every 6-8 hours as needed.      Acetaminophen (Tylenol): This is a good  fever reducer.  If your body temperature rises above 101.5 as measured with a thermometer, it is recommended that you take 1,000 mg every 8 hours until your temperature falls below 101.5, please not take more than 3,000 mg of acetaminophen either as a separate medication or as in ingredient in an over-the-counter cold/flu preparation within a 24-hour period.      Lidocaine (Xylocaine): This is a numbing medication that can be swished for 15 seconds and swallowed.  You can use this every 3 hours while awake to relieve pain in your mouth and throat.  I have sent a prescription for this medication to your pharmacy.   Please  remain home from work, school, public places until you have been fever free for 24 hours without the use of antifever medications such as Tylenol or ibuprofen.    Please follow-up within the next 3 to 5 days either with your primary care provider or urgent care if your symptoms do not resolve.  If you do not have a primary care provider, we will assist you in finding one.       This office note has been dictated using Museum/gallery curator.  Unfortunately, and despite my best efforts, this method of dictation can sometimes lead to occasional typographical or grammatical errors.  I apologize in advance if this occurs.     Lynden Oxford Scales, PA-C 12/28/21 1250

## 2021-12-30 LAB — COVID-19, FLU A+B NAA
Influenza A, NAA: NOT DETECTED
Influenza B, NAA: NOT DETECTED
SARS-CoV-2, NAA: NOT DETECTED

## 2021-12-31 LAB — CULTURE, GROUP A STREP (THRC)

## 2022-01-23 DIAGNOSIS — M7061 Trochanteric bursitis, right hip: Secondary | ICD-10-CM | POA: Diagnosis not present

## 2022-01-23 DIAGNOSIS — M545 Low back pain, unspecified: Secondary | ICD-10-CM | POA: Diagnosis not present

## 2022-01-23 DIAGNOSIS — M25551 Pain in right hip: Secondary | ICD-10-CM | POA: Diagnosis not present

## 2022-01-25 ENCOUNTER — Encounter: Payer: Self-pay | Admitting: Internal Medicine

## 2022-02-05 DIAGNOSIS — M7061 Trochanteric bursitis, right hip: Secondary | ICD-10-CM | POA: Diagnosis not present

## 2022-02-05 DIAGNOSIS — M5416 Radiculopathy, lumbar region: Secondary | ICD-10-CM | POA: Diagnosis not present

## 2022-02-12 DIAGNOSIS — M5416 Radiculopathy, lumbar region: Secondary | ICD-10-CM | POA: Diagnosis not present

## 2022-02-12 DIAGNOSIS — M7061 Trochanteric bursitis, right hip: Secondary | ICD-10-CM | POA: Diagnosis not present

## 2022-02-19 DIAGNOSIS — M5416 Radiculopathy, lumbar region: Secondary | ICD-10-CM | POA: Diagnosis not present

## 2022-02-19 DIAGNOSIS — M7061 Trochanteric bursitis, right hip: Secondary | ICD-10-CM | POA: Diagnosis not present

## 2022-02-27 DIAGNOSIS — M7061 Trochanteric bursitis, right hip: Secondary | ICD-10-CM | POA: Diagnosis not present

## 2022-02-27 DIAGNOSIS — M5416 Radiculopathy, lumbar region: Secondary | ICD-10-CM | POA: Diagnosis not present

## 2022-03-05 DIAGNOSIS — M5416 Radiculopathy, lumbar region: Secondary | ICD-10-CM | POA: Diagnosis not present

## 2022-03-05 DIAGNOSIS — M7061 Trochanteric bursitis, right hip: Secondary | ICD-10-CM | POA: Diagnosis not present

## 2022-03-12 DIAGNOSIS — M5416 Radiculopathy, lumbar region: Secondary | ICD-10-CM | POA: Diagnosis not present

## 2022-03-12 DIAGNOSIS — M7061 Trochanteric bursitis, right hip: Secondary | ICD-10-CM | POA: Diagnosis not present

## 2022-03-19 DIAGNOSIS — M7061 Trochanteric bursitis, right hip: Secondary | ICD-10-CM | POA: Diagnosis not present

## 2022-03-19 DIAGNOSIS — M5416 Radiculopathy, lumbar region: Secondary | ICD-10-CM | POA: Diagnosis not present

## 2022-03-26 ENCOUNTER — Other Ambulatory Visit: Payer: Self-pay | Admitting: Orthopedic Surgery

## 2022-03-26 DIAGNOSIS — M25551 Pain in right hip: Secondary | ICD-10-CM | POA: Diagnosis not present

## 2022-03-26 DIAGNOSIS — M545 Low back pain, unspecified: Secondary | ICD-10-CM

## 2022-03-26 DIAGNOSIS — M5451 Vertebrogenic low back pain: Secondary | ICD-10-CM | POA: Diagnosis not present

## 2022-04-15 ENCOUNTER — Ambulatory Visit
Admission: RE | Admit: 2022-04-15 | Discharge: 2022-04-15 | Disposition: A | Discharge status: Home / Self Care | Payer: 59 | Source: Ambulatory Visit | Attending: Orthopedic Surgery | Admitting: Orthopedic Surgery

## 2022-04-15 ENCOUNTER — Ambulatory Visit
Admission: RE | Admit: 2022-04-15 | Discharge: 2022-04-15 | Disposition: A | Discharge status: Home / Self Care | Payer: BC Managed Care – PPO | Source: Ambulatory Visit | Attending: Orthopedic Surgery | Admitting: Orthopedic Surgery

## 2022-04-15 DIAGNOSIS — M25451 Effusion, right hip: Secondary | ICD-10-CM | POA: Diagnosis not present

## 2022-04-15 DIAGNOSIS — R600 Localized edema: Secondary | ICD-10-CM | POA: Diagnosis not present

## 2022-04-15 DIAGNOSIS — M25551 Pain in right hip: Secondary | ICD-10-CM

## 2022-04-15 DIAGNOSIS — M545 Low back pain, unspecified: Secondary | ICD-10-CM

## 2022-04-15 DIAGNOSIS — M1611 Unilateral primary osteoarthritis, right hip: Secondary | ICD-10-CM | POA: Diagnosis not present

## 2022-04-19 DIAGNOSIS — M25551 Pain in right hip: Secondary | ICD-10-CM | POA: Diagnosis not present

## 2022-04-19 DIAGNOSIS — M5441 Lumbago with sciatica, right side: Secondary | ICD-10-CM | POA: Diagnosis not present

## 2022-04-19 DIAGNOSIS — M5451 Vertebrogenic low back pain: Secondary | ICD-10-CM | POA: Diagnosis not present

## 2022-04-19 DIAGNOSIS — M1611 Unilateral primary osteoarthritis, right hip: Secondary | ICD-10-CM | POA: Diagnosis not present

## 2022-04-30 DIAGNOSIS — M5416 Radiculopathy, lumbar region: Secondary | ICD-10-CM | POA: Diagnosis not present

## 2022-05-08 DIAGNOSIS — M5442 Lumbago with sciatica, left side: Secondary | ICD-10-CM | POA: Diagnosis not present

## 2022-05-08 DIAGNOSIS — M25552 Pain in left hip: Secondary | ICD-10-CM | POA: Diagnosis not present

## 2022-05-08 DIAGNOSIS — M5451 Vertebrogenic low back pain: Secondary | ICD-10-CM | POA: Diagnosis not present

## 2022-05-08 DIAGNOSIS — M25551 Pain in right hip: Secondary | ICD-10-CM | POA: Diagnosis not present

## 2022-05-17 DIAGNOSIS — M5416 Radiculopathy, lumbar region: Secondary | ICD-10-CM | POA: Diagnosis not present

## 2022-06-01 DIAGNOSIS — M5416 Radiculopathy, lumbar region: Secondary | ICD-10-CM | POA: Diagnosis not present

## 2022-06-20 DIAGNOSIS — M5416 Radiculopathy, lumbar region: Secondary | ICD-10-CM | POA: Diagnosis not present

## 2022-07-02 DIAGNOSIS — M533 Sacrococcygeal disorders, not elsewhere classified: Secondary | ICD-10-CM | POA: Diagnosis not present

## 2022-07-03 ENCOUNTER — Other Ambulatory Visit: Payer: Self-pay | Admitting: Orthopedic Surgery

## 2022-07-03 DIAGNOSIS — M545 Low back pain, unspecified: Secondary | ICD-10-CM

## 2022-07-31 ENCOUNTER — Ambulatory Visit
Admission: RE | Admit: 2022-07-31 | Discharge: 2022-07-31 | Disposition: A | Discharge status: Home / Self Care | Payer: BC Managed Care – PPO | Source: Ambulatory Visit | Attending: Orthopedic Surgery | Admitting: Orthopedic Surgery

## 2022-07-31 DIAGNOSIS — G8929 Other chronic pain: Secondary | ICD-10-CM

## 2022-08-06 ENCOUNTER — Other Ambulatory Visit: Payer: Self-pay | Admitting: Orthopedic Surgery

## 2022-08-06 DIAGNOSIS — G8929 Other chronic pain: Secondary | ICD-10-CM

## 2022-08-06 DIAGNOSIS — M533 Sacrococcygeal disorders, not elsewhere classified: Secondary | ICD-10-CM | POA: Diagnosis not present

## 2022-08-30 ENCOUNTER — Ambulatory Visit
Admission: RE | Admit: 2022-08-30 | Discharge: 2022-08-30 | Disposition: A | Discharge status: Home / Self Care | Payer: BC Managed Care – PPO | Source: Ambulatory Visit | Attending: Orthopedic Surgery | Admitting: Orthopedic Surgery

## 2022-08-30 DIAGNOSIS — G8929 Other chronic pain: Secondary | ICD-10-CM

## 2022-08-30 MED ORDER — METHYLPREDNISOLONE ACETATE 40 MG/ML INJ SUSP (RADIOLOG: 120.0000 mg | Freq: Once | INTRAMUSCULAR | Status: DC

## 2022-09-10 DIAGNOSIS — M533 Sacrococcygeal disorders, not elsewhere classified: Secondary | ICD-10-CM | POA: Diagnosis not present

## 2022-09-11 DIAGNOSIS — M461 Sacroiliitis, not elsewhere classified: Secondary | ICD-10-CM | POA: Diagnosis not present

## 2022-09-14 DIAGNOSIS — M461 Sacroiliitis, not elsewhere classified: Secondary | ICD-10-CM | POA: Diagnosis not present

## 2022-09-14 IMAGING — MR MR LUMBAR SPINE W/O CM
4 of 5 series · 18 of 48 positions shown · non-contrast
Comparison: MRI lumbar spine 12/15/2017

CLINICAL DATA: Lower back pain

EXAM:
MRI LUMBAR SPINE WITHOUT CONTRAST
TECHNIQUE: Multiplanar, multisequence MR imaging of the lumbar spine was
performed. No intravenous contrast was administered.

[Series 5: T2 · sagittal · 4.0mm · 0.78mm/px · 6 of 15 slices shown (1 of 2)]
[im 1/15]
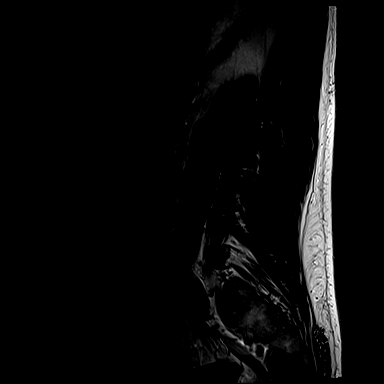
[im 3/15]
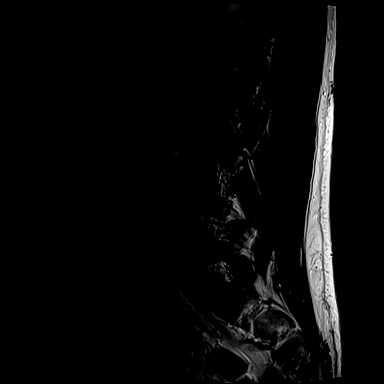
[im 6/15]
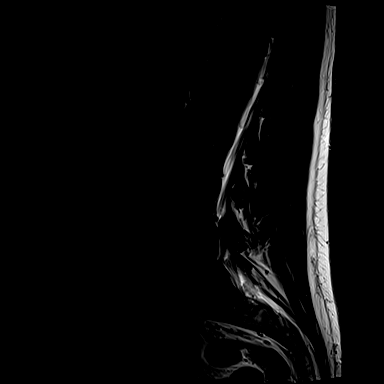
[im 9/15]
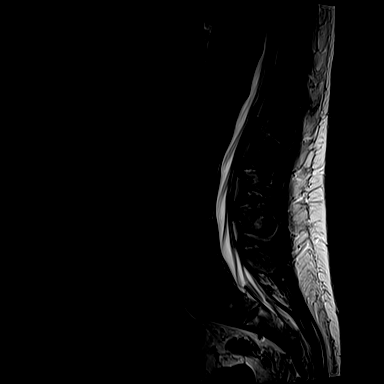
[im 12/15]
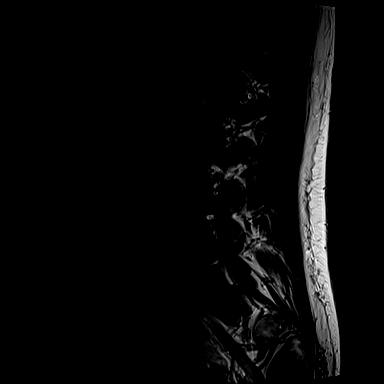
[im 15/15]
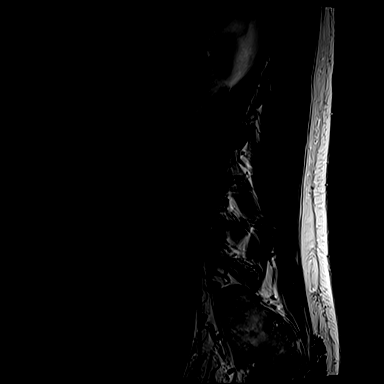

[Series 6: T1 · sagittal · 4.0mm · 0.73mm/px · 3 of 15 slices shown (1 of 2)]
[im 1/15]
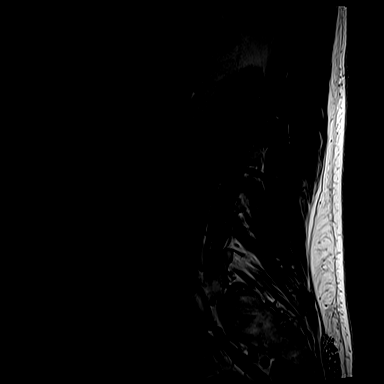
[im 8/15]
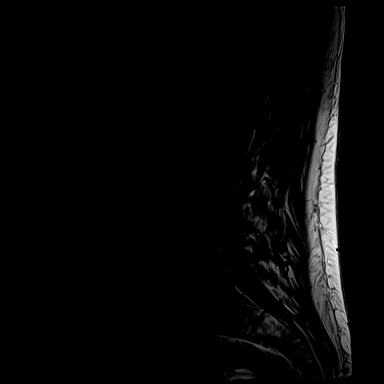
[im 15/15]
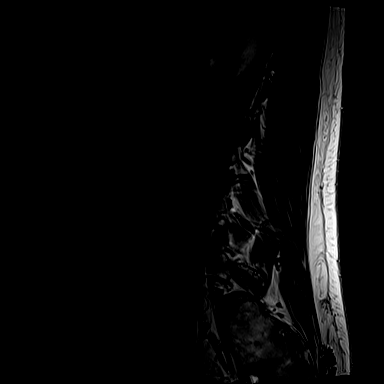

[Series 10: T1 · axial · 4.0mm · 0.28mm/px · z∈[-35,+144]mm · 3 of 43 slices shown (2 of 2)]
[im 6/43]
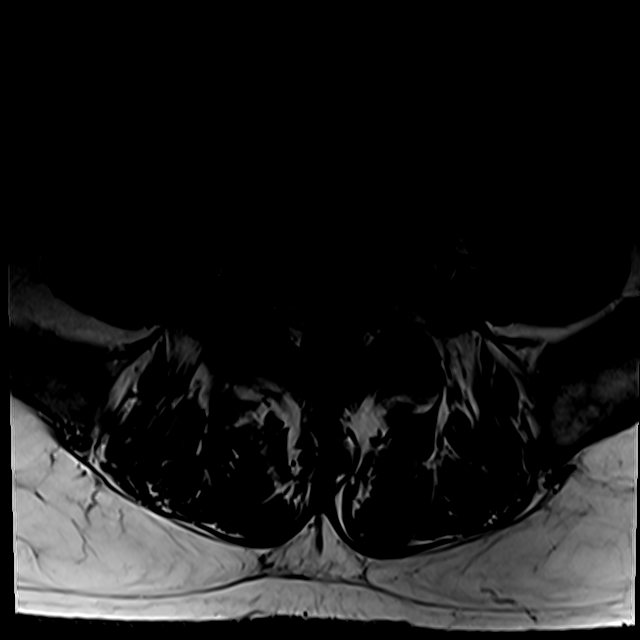
[im 23/43]
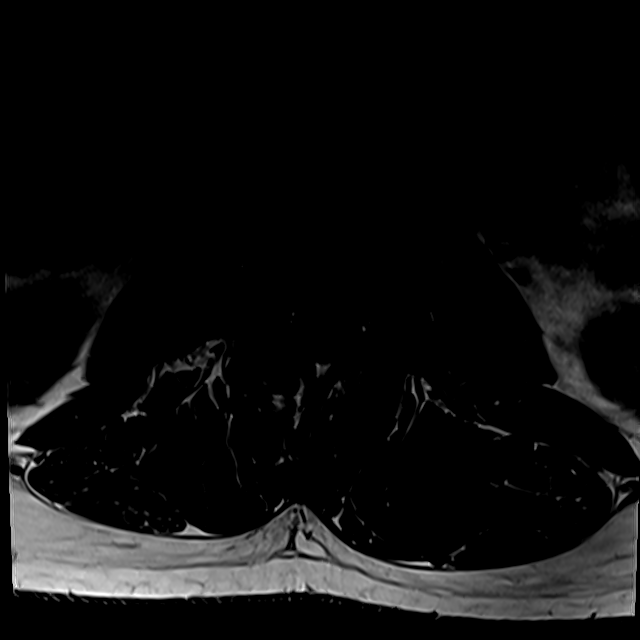
[im 37/43]
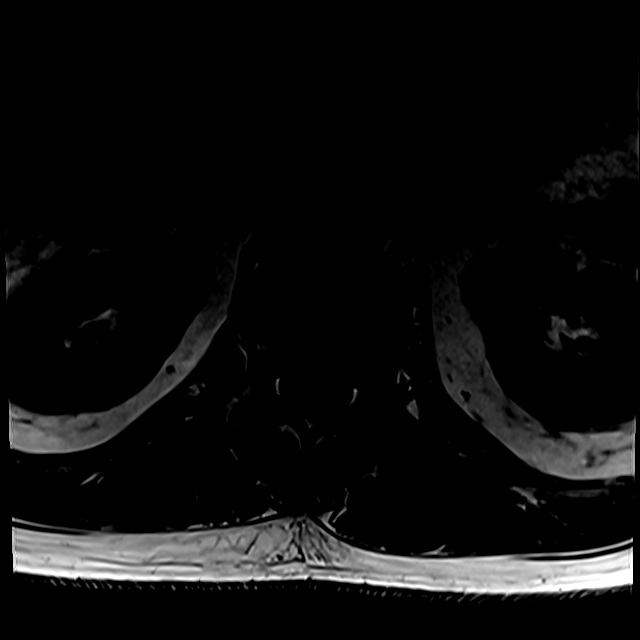

[Series 13: T2 · axial · 4.0mm · 0.28mm/px · z∈[-50,+144]mm · 6 of 43 slices shown (2 of 2)]
[im 3/43]
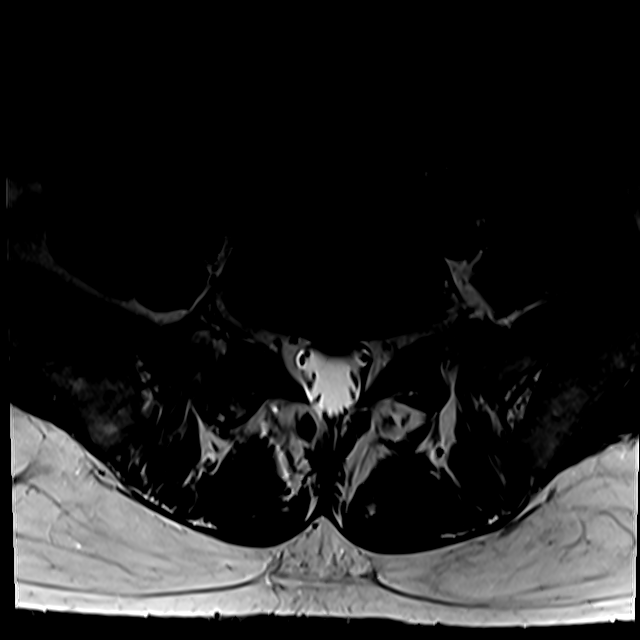
[im 6/43]
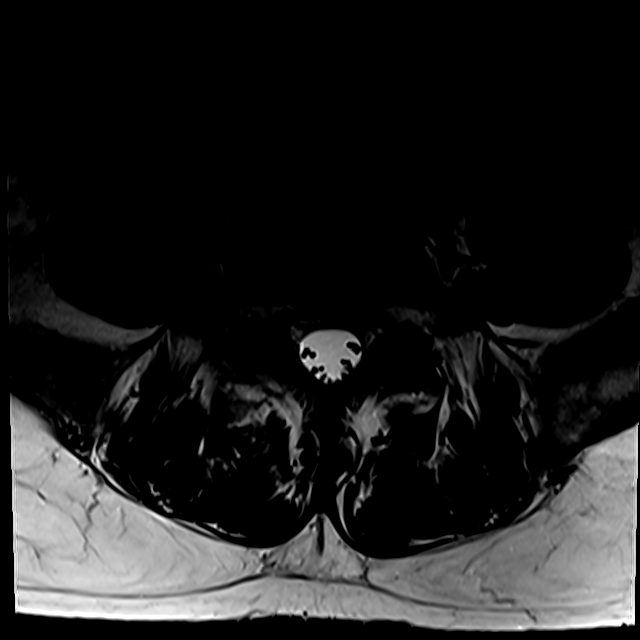
[im 9/43]
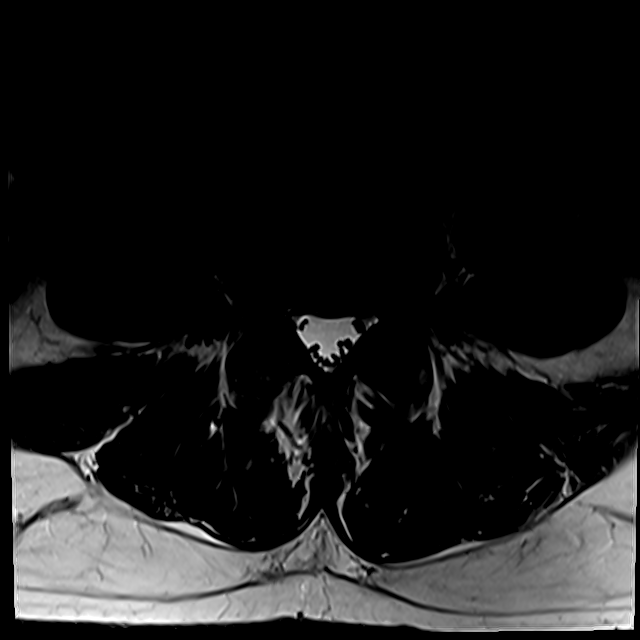
[im 15/43]
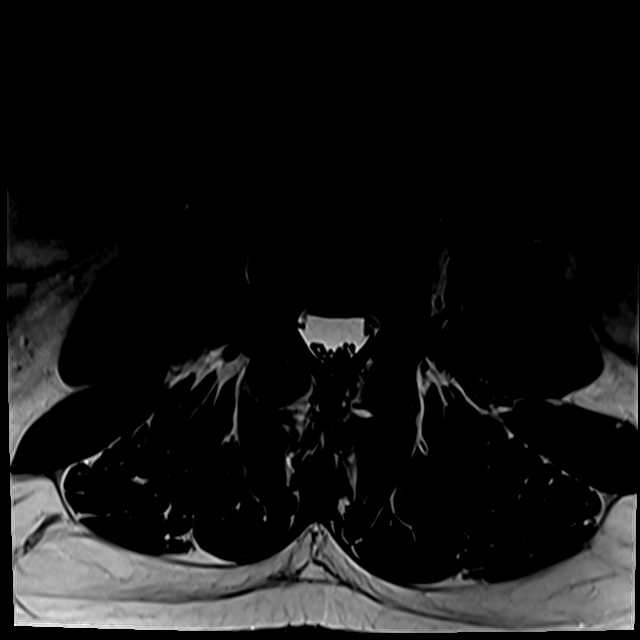
[im 23/43]
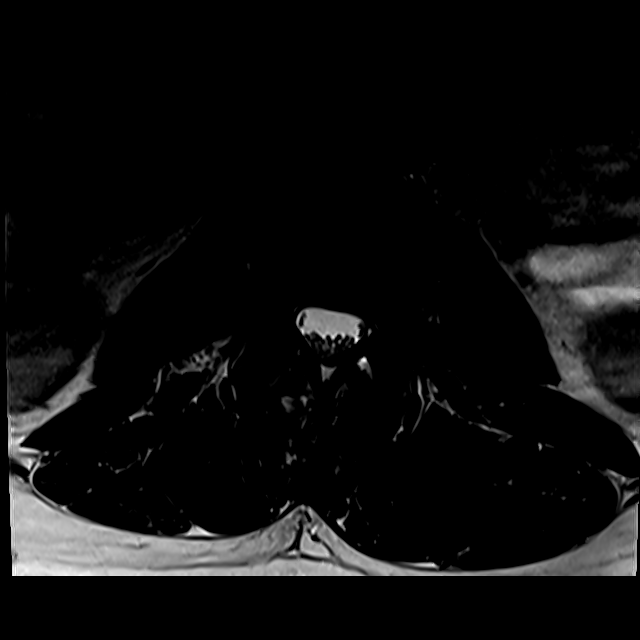
[im 37/43]
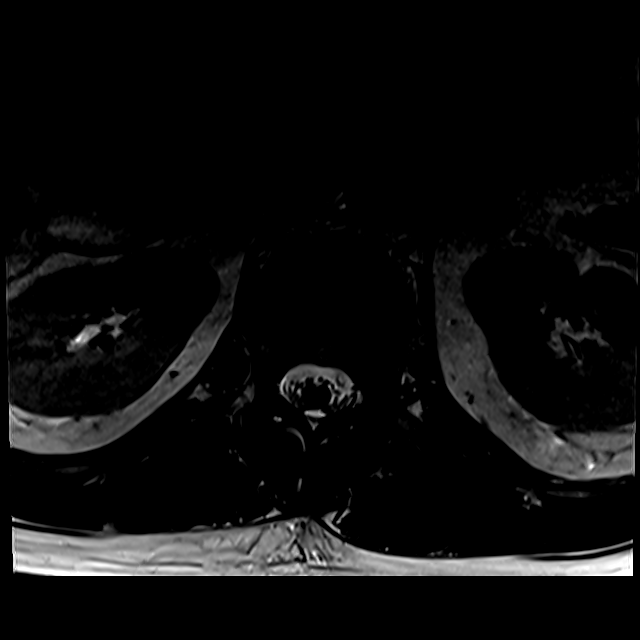

[18 of 48 positions shown; findings below may reference images not displayed]

FINDINGS: Segmentation:  Standard.

Alignment:  Trace degenerative retrolisthesis at L1-L2.

Vertebrae: No fracture, evidence of discitis, or aggressive bone
lesion.

Conus medullaris and cauda equina: Conus extends to the L1 level.
Conus and cauda equina appear normal.

Paraspinal and other soft tissues: Negative

Disc levels: Multilevel disc desiccation.

T12-L1: No significant stenosis.

L1-L2: Disc desiccation with minimal disc bulging. Trace
degenerative retrolisthesis. No significant stenosis.

L2-L3: No significant disc bulge. Mild bilateral facet arthropathy.
No significant stenosis.

L3-L4: No significant stenosis.

L4-L5: Disc desiccation with asymmetric left disc bulging increase
in the region of the left neural foramen (sagittal T2 image 11).
Mild bilateral facet arthropathy. No significant spinal canal
stenosis. There is progressive, mild to moderate left-sided neural
foraminal stenosis. No right neural foraminal stenosis.

L5-S1: No significant disc bulge. Mild bilateral facet arthropathy.
No significant stenosis.
IMPRESSION: Degenerative disc disease at L4-L5 and facet arthropathy
contributing to mild to moderate left-sided neural foraminal
stenosis progressed in comparison to prior exam. No significant
spinal canal stenosis at any level.

## 2022-09-19 DIAGNOSIS — M461 Sacroiliitis, not elsewhere classified: Secondary | ICD-10-CM | POA: Diagnosis not present

## 2022-09-21 DIAGNOSIS — M461 Sacroiliitis, not elsewhere classified: Secondary | ICD-10-CM | POA: Diagnosis not present

## 2022-09-24 DIAGNOSIS — M461 Sacroiliitis, not elsewhere classified: Secondary | ICD-10-CM | POA: Diagnosis not present

## 2022-09-25 DIAGNOSIS — M533 Sacrococcygeal disorders, not elsewhere classified: Secondary | ICD-10-CM | POA: Diagnosis not present

## 2022-10-02 DIAGNOSIS — M461 Sacroiliitis, not elsewhere classified: Secondary | ICD-10-CM | POA: Diagnosis not present

## 2022-10-26 DIAGNOSIS — M461 Sacroiliitis, not elsewhere classified: Secondary | ICD-10-CM | POA: Diagnosis not present

## 2022-10-30 ENCOUNTER — Telehealth: Payer: Self-pay | Admitting: Internal Medicine

## 2022-10-30 NOTE — Telephone Encounter (Signed)
Patient is scheduled to have surgery and needs a physical before they will perform surgery. Per wife, Patient prefers male providers and would like to switch to [facility]. Please advise if transfer okay.

## 2022-10-30 NOTE — Telephone Encounter (Signed)
Last OV w/ Dr. Drue Novel was 2019, technically he is no longer Dr. Leta Jungling patient. Okay to transfer care.

## 2022-11-01 NOTE — Telephone Encounter (Signed)
TOC appt scheduled w/ Ladona Ridgel on 11/14/22.

## 2022-11-10 DIAGNOSIS — G8929 Other chronic pain: Secondary | ICD-10-CM | POA: Diagnosis not present

## 2022-11-10 DIAGNOSIS — M533 Sacrococcygeal disorders, not elsewhere classified: Secondary | ICD-10-CM | POA: Diagnosis not present

## 2022-11-14 ENCOUNTER — Ambulatory Visit: Payer: BC Managed Care – PPO | Admitting: Family Medicine

## 2022-11-14 ENCOUNTER — Encounter: Payer: Self-pay | Admitting: Family Medicine

## 2022-11-14 VITALS — BP 136/80 | HR 81 | Temp 98.2°F | Resp 16 | Ht 72.0 in | Wt 187.0 lb

## 2022-11-14 DIAGNOSIS — Z1159 Encounter for screening for other viral diseases: Secondary | ICD-10-CM

## 2022-11-14 DIAGNOSIS — Z0000 Encounter for general adult medical examination without abnormal findings: Secondary | ICD-10-CM

## 2022-11-14 DIAGNOSIS — Z23 Encounter for immunization: Secondary | ICD-10-CM

## 2022-11-14 DIAGNOSIS — Z1211 Encounter for screening for malignant neoplasm of colon: Secondary | ICD-10-CM | POA: Diagnosis not present

## 2022-11-14 LAB — CBC WITH DIFFERENTIAL/PLATELET
Basophils Absolute: 0 10*3/uL (ref 0.0–0.1)
Basophils Relative: 0.5 % (ref 0.0–3.0)
Eosinophils Absolute: 0.4 10*3/uL (ref 0.0–0.7)
Eosinophils Relative: 6.9 % — ABNORMAL HIGH (ref 0.0–5.0)
HCT: 43.1 % (ref 39.0–52.0)
Hemoglobin: 14.8 g/dL (ref 13.0–17.0)
Lymphocytes Relative: 35.6 % (ref 12.0–46.0)
Lymphs Abs: 2.3 10*3/uL (ref 0.7–4.0)
MCHC: 34.4 g/dL (ref 30.0–36.0)
MCV: 98.5 fl (ref 78.0–100.0)
Monocytes Absolute: 0.4 10*3/uL (ref 0.1–1.0)
Monocytes Relative: 6.7 % (ref 3.0–12.0)
Neutro Abs: 3.2 10*3/uL (ref 1.4–7.7)
Neutrophils Relative %: 50.3 % (ref 43.0–77.0)
Platelets: 182 10*3/uL (ref 150.0–400.0)
RBC: 4.37 Mil/uL (ref 4.22–5.81)
RDW: 13.3 % (ref 11.5–15.5)
WBC: 6.4 10*3/uL (ref 4.0–10.5)

## 2022-11-14 LAB — COMPREHENSIVE METABOLIC PANEL
ALT: 17 U/L (ref 0–53)
AST: 23 U/L (ref 0–37)
Albumin: 4.4 g/dL (ref 3.5–5.2)
Alkaline Phosphatase: 61 U/L (ref 39–117)
BUN: 12 mg/dL (ref 6–23)
CO2: 30 mEq/L (ref 19–32)
Calcium: 9.4 mg/dL (ref 8.4–10.5)
Chloride: 103 mEq/L (ref 96–112)
Creatinine, Ser: 0.84 mg/dL (ref 0.40–1.50)
GFR: 104.52 mL/min (ref >60.00)
Glucose, Bld: 86 mg/dL (ref 70–99)
Potassium: 4.4 mEq/L (ref 3.5–5.1)
Sodium: 141 mEq/L (ref 135–145)
Total Bilirubin: 0.5 mg/dL (ref 0.2–1.2)
Total Protein: 6.8 g/dL (ref 6.0–8.3)

## 2022-11-14 LAB — LIPID PANEL
Cholesterol: 186 mg/dL (ref 0–200)
HDL: 60.6 mg/dL (ref >39.00)
LDL Cholesterol: 102 mg/dL — ABNORMAL HIGH (ref 0–99)
NonHDL: 124.93
Total CHOL/HDL Ratio: 3
Triglycerides: 116 mg/dL (ref 0.0–149.0)
VLDL: 23.2 mg/dL (ref 0.0–40.0)

## 2022-11-14 NOTE — Progress Notes (Signed)
Complete physical exam  Patient: Elijah Pennington   DOB: 09-07-1976   46 y.o. Male  MRN: [id number]  Subjective:    Chief Complaint  Patient presents with   Transitions Of Care    Elijah Pennington is a 46 y.o. male who presents today for a complete physical exam. He reports consuming a general diet. The patient does not participate in regular exercise at present. He generally feels well. He reports sleeping well. He does not have additional problems to discuss today.    Most recent fall risk assessment:    11/14/2022    8:56 AM  Fall Risk   Falls in the past year? 0  Number falls in past yr: 0  Injury with Fall? 0     Most recent depression screenings:    11/14/2022    9:14 AM 10/03/2018    2:13 PM  PHQ 2/9 Scores  PHQ - 2 Score 0 0  PHQ- 9 Score 4       11/14/2022    9:14 AM  GAD 7 : Generalized Anxiety Score  Nervous, Anxious, on Edge 0  Control/stop worrying 0  Worry too much - different things 0  Trouble relaxing 0  Restless 0  Easily annoyed or irritable 0  Afraid - awful might happen 0  Total GAD 7 Score 0  Anxiety Difficulty Not difficult at all      Vision:Not within last year , Dental: No current dental problems and Receives regular dental care, and STD: The patient denies history of sexually transmitted disease.  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 denies   Radiculopathy of cervical region 2014   Secondary to new disc protrusion, s/p C6-7 ACDF 08/04/2013   Past Surgical History:  Procedure Laterality Date   ANTERIOR CERVICAL DECOMP/DISCECTOMY FUSION  09/01/2020   Procedure: ANTERIOR CERVICAL DECOMPRESSION FUSION CERVICAL 5-6 WITH INSTRUMENTATION AND ALLOGRAFT, EXPLANT HARDWARE C6-C7;  Surgeon: Estill Bamberg, MD;  Location: [facility] OR;  Service: Orthopedics;;   Anterior cervical discectomy and fusion  08/04/2013   Allergies  Allergen Reactions   Penicillins Hives      Patient Care Team: Clayborne Dana, NP as PCP - General (Family Medicine) Estill Bamberg, MD as Consulting Physician (Orthopedic Surgery)   Outpatient Medications Prior to Visit  Medication Sig   albuterol (VENTOLIN HFA) 108 (90 Base) MCG/ACT inhaler Inhale 2 puffs into the lungs every 6 (six) hours as needed for wheezing or shortness of breath.   methocarbamol (ROBAXIN) 500 MG tablet Take 500 mg by mouth.   traMADol (ULTRAM) 50 MG tablet Take 50 mg by mouth.   [DISCONTINUED] lidocaine (XYLOCAINE) 2 % solution Use as directed 15 mLs in the mouth or throat every 3 (  three) hours as needed for mouth pain (Sore throat).   No facility-administered medications prior to visit.    ROS All review of systems negative except what is listed in the HPI        Objective:     BP 136/80   Pulse 81   Temp 98.2 F (36.8 C)   Resp 16   Ht 6' (1.829 m)   Wt 187 lb (84.8 kg)   SpO2 100%   BMI 25.36 kg/m    Physical Exam Vitals reviewed.  Constitutional:      General: He is not in acute distress.    Appearance: Normal appearance. He is not ill-appearing.  HENT:     Head: Normocephalic and atraumatic.     Right Ear: Tympanic membrane normal.     Left Ear: Tympanic membrane normal.     Nose: Nose normal.     Mouth/Throat:     Mouth: Mucous membranes are  moist.     Pharynx: Oropharynx is clear.  Eyes:     Extraocular Movements: Extraocular movements intact.     Conjunctiva/sclera: Conjunctivae normal.     Pupils: Pupils are equal, round, and reactive to light.  Neck:     Vascular: No carotid bruit.  Cardiovascular:     Rate and Rhythm: Normal rate and regular rhythm.     Pulses: Normal pulses.     Heart sounds: Normal heart sounds.  Pulmonary:     Effort: Pulmonary effort is normal.     Breath sounds: Normal breath sounds.  Abdominal:     General: Abdomen is flat. Bowel sounds are normal. There is no distension.     Palpations: Abdomen is soft. There is no mass.     Tenderness: There is no abdominal tenderness. There is no right CVA tenderness, left CVA tenderness, guarding or rebound.  Genitourinary:    Comments: Deferred exam Musculoskeletal:        General: Normal range of motion.     Cervical back: Normal range of motion and neck supple. No tenderness.     Right lower leg: No edema.     Left lower leg: No edema.  Lymphadenopathy:     Cervical: No cervical adenopathy.  Skin:    General: Skin is warm and dry.     Capillary Refill: Capillary refill takes less than 2 seconds.  Neurological:     General: No focal deficit present.     Mental Status: He is alert and oriented to person, place, and time. Mental status is at baseline.  Psychiatric:        Mood and Affect: Mood normal.        Behavior: Behavior normal.        Thought Content: Thought content normal.        Judgment: Judgment normal.        No results found for any visits on 11/14/22.     Assessment & Plan:    Routine Health Maintenance and Physical Exam  Immunization History  Administered Date(s) Administered   Influenza Split 08/25/2012   Influenza,inj,Quad PF,6+ Mos 08/25/2013, 08/10/2016, 09/20/2017, 10/03/2018, 11/14/2022   PFIZER(Purple Top)SARS-COV-2 Vaccination 02/16/2020, 03/08/2020, 11/14/2020   Tdap 08/10/2016    Health Maintenance   Topic Date Due   Hepatitis C Screening  Never done   COLONOSCOPY (Pts 45-35yrs Insurance coverage will need to be confirmed)  Never done   COVID-19 Vaccine (4 - 2023-24 season) 07/27/2022   DTaP/Tdap/Td (2 - Td or Tdap) 08/10/2026  INFLUENZA VACCINE  Completed   HIV Screening  Completed   HPV VACCINES  Aged Out    Discussed health benefits of physical activity, and encouraged him to engage in regular exercise appropriate for his age and condition.  Problem List Items Addressed This Visit       Other   Annual physical exam - Primary   Relevant Orders   CBC with Differential/Platelet   Comprehensive metabolic panel   Lipid panel   Cologuard   Hepatitis C antibody   Ambulatory referral to Dermatology   Other Visit Diagnoses     Encounter for hepatitis C screening test for low risk patient       Relevant Orders   Hepatitis C antibody   Screen for colon cancer       Relevant Orders   Cologuard   Need for influenza vaccination       Relevant Orders   Flu Vaccine QUAD 36+ mos IM (Fluarix, Fluzone & Afluria Quad PF (Completed)          PATIENT COUNSELING:    Sexuality: Discussed sexually transmitted diseases, partner selection, use of condoms, avoidance of unintended pregnancy, and contraceptive alternatives.    I discussed with the patient that most people either abstain from alcohol or drink within safe limits (<=14/week and <=4 drinks/occasion for males, <=7/weeks and <= 3 drinks/occasion for females) and that the risk for alcohol disorders and other health effects rises proportionally with the number of drinks per week and how often a drinker exceeds daily limits.  Discussed cessation/primary prevention of drug use and availability of treatment for abuse.   Diet: Encouraged to adjust caloric intake to maintain or achieve ideal body weight, to reduce intake of dietary saturated fat and total fat, to limit sodium intake by avoiding high sodium foods and not adding  table salt, and to maintain adequate dietary potassium and calcium preferably from fresh fruits, vegetables, and low-fat dairy products. Encouraged vitamin D 1000 units and Calcium 1300mg  or 4 servings of dairy a day.  Emphasized the importance of regular exercise.  Injury prevention: Discussed safety belts, safety helmets, smoke detector, smoking near bedding or upholstery.   Dental health: Discussed importance of regular tooth brushing, flossing, and dental visits.      Return in about 1 year (around 11/15/2023) for routine follow-up.     11/17/2023, NP

## 2022-11-15 LAB — HEPATITIS C ANTIBODY: Hepatitis C Ab: NONREACTIVE

## 2022-11-20 DIAGNOSIS — M461 Sacroiliitis, not elsewhere classified: Secondary | ICD-10-CM | POA: Diagnosis not present

## 2022-11-21 ENCOUNTER — Encounter: Payer: Self-pay | Admitting: Family Medicine

## 2022-12-05 DIAGNOSIS — M461 Sacroiliitis, not elsewhere classified: Secondary | ICD-10-CM | POA: Diagnosis not present

## 2023-01-02 DIAGNOSIS — D229 Melanocytic nevi, unspecified: Secondary | ICD-10-CM | POA: Diagnosis not present

## 2023-01-02 DIAGNOSIS — L812 Freckles: Secondary | ICD-10-CM | POA: Diagnosis not present

## 2023-01-02 DIAGNOSIS — L821 Other seborrheic keratosis: Secondary | ICD-10-CM | POA: Diagnosis not present

## 2023-01-02 DIAGNOSIS — L814 Other melanin hyperpigmentation: Secondary | ICD-10-CM | POA: Diagnosis not present

## 2023-01-07 DIAGNOSIS — M461 Sacroiliitis, not elsewhere classified: Secondary | ICD-10-CM | POA: Diagnosis not present

## 2023-01-09 DIAGNOSIS — Z9889 Other specified postprocedural states: Secondary | ICD-10-CM | POA: Diagnosis not present

## 2023-01-09 DIAGNOSIS — M25651 Stiffness of right hip, not elsewhere classified: Secondary | ICD-10-CM | POA: Diagnosis not present

## 2023-01-09 DIAGNOSIS — R531 Weakness: Secondary | ICD-10-CM | POA: Diagnosis not present

## 2023-01-09 DIAGNOSIS — R2689 Other abnormalities of gait and mobility: Secondary | ICD-10-CM | POA: Diagnosis not present

## 2023-01-14 DIAGNOSIS — R531 Weakness: Secondary | ICD-10-CM | POA: Diagnosis not present

## 2023-01-14 DIAGNOSIS — R2689 Other abnormalities of gait and mobility: Secondary | ICD-10-CM | POA: Diagnosis not present

## 2023-01-14 DIAGNOSIS — Z9889 Other specified postprocedural states: Secondary | ICD-10-CM | POA: Diagnosis not present

## 2023-01-16 DIAGNOSIS — R2689 Other abnormalities of gait and mobility: Secondary | ICD-10-CM | POA: Diagnosis not present

## 2023-01-16 DIAGNOSIS — R531 Weakness: Secondary | ICD-10-CM | POA: Diagnosis not present

## 2023-01-16 DIAGNOSIS — M25651 Stiffness of right hip, not elsewhere classified: Secondary | ICD-10-CM | POA: Diagnosis not present

## 2023-01-16 DIAGNOSIS — Z9889 Other specified postprocedural states: Secondary | ICD-10-CM | POA: Diagnosis not present

## 2023-01-21 DIAGNOSIS — Z9889 Other specified postprocedural states: Secondary | ICD-10-CM | POA: Diagnosis not present

## 2023-01-21 DIAGNOSIS — R2689 Other abnormalities of gait and mobility: Secondary | ICD-10-CM | POA: Diagnosis not present

## 2023-01-21 DIAGNOSIS — R531 Weakness: Secondary | ICD-10-CM | POA: Diagnosis not present

## 2023-01-21 DIAGNOSIS — M25651 Stiffness of right hip, not elsewhere classified: Secondary | ICD-10-CM | POA: Diagnosis not present

## 2023-01-23 DIAGNOSIS — R531 Weakness: Secondary | ICD-10-CM | POA: Diagnosis not present

## 2023-01-23 DIAGNOSIS — Z9889 Other specified postprocedural states: Secondary | ICD-10-CM | POA: Diagnosis not present

## 2023-01-23 DIAGNOSIS — M25651 Stiffness of right hip, not elsewhere classified: Secondary | ICD-10-CM | POA: Diagnosis not present

## 2023-01-23 DIAGNOSIS — R2689 Other abnormalities of gait and mobility: Secondary | ICD-10-CM | POA: Diagnosis not present

## 2023-01-28 DIAGNOSIS — Z9889 Other specified postprocedural states: Secondary | ICD-10-CM | POA: Diagnosis not present

## 2023-01-28 DIAGNOSIS — R531 Weakness: Secondary | ICD-10-CM | POA: Diagnosis not present

## 2023-01-28 DIAGNOSIS — R2689 Other abnormalities of gait and mobility: Secondary | ICD-10-CM | POA: Diagnosis not present

## 2023-01-28 DIAGNOSIS — M25651 Stiffness of right hip, not elsewhere classified: Secondary | ICD-10-CM | POA: Diagnosis not present

## 2023-01-30 DIAGNOSIS — Z9889 Other specified postprocedural states: Secondary | ICD-10-CM | POA: Diagnosis not present

## 2023-01-30 DIAGNOSIS — R2689 Other abnormalities of gait and mobility: Secondary | ICD-10-CM | POA: Diagnosis not present

## 2023-01-30 DIAGNOSIS — M25651 Stiffness of right hip, not elsewhere classified: Secondary | ICD-10-CM | POA: Diagnosis not present

## 2023-01-30 DIAGNOSIS — R531 Weakness: Secondary | ICD-10-CM | POA: Diagnosis not present

## 2023-02-04 DIAGNOSIS — M25651 Stiffness of right hip, not elsewhere classified: Secondary | ICD-10-CM | POA: Diagnosis not present

## 2023-02-04 DIAGNOSIS — Z9889 Other specified postprocedural states: Secondary | ICD-10-CM | POA: Diagnosis not present

## 2023-02-04 DIAGNOSIS — R2689 Other abnormalities of gait and mobility: Secondary | ICD-10-CM | POA: Diagnosis not present

## 2023-02-04 DIAGNOSIS — R531 Weakness: Secondary | ICD-10-CM | POA: Diagnosis not present

## 2023-02-18 DIAGNOSIS — M533 Sacrococcygeal disorders, not elsewhere classified: Secondary | ICD-10-CM | POA: Diagnosis not present

## 2023-02-25 DIAGNOSIS — Z1211 Encounter for screening for malignant neoplasm of colon: Secondary | ICD-10-CM | POA: Diagnosis not present

## 2023-03-02 LAB — COLOGUARD: COLOGUARD: NEGATIVE

## 2023-04-27 ENCOUNTER — Ambulatory Visit
Admission: EM | Admit: 2023-04-27 | Discharge: 2023-04-27 | Disposition: A | Discharge status: Home / Self Care | Payer: BC Managed Care – PPO | Attending: Nurse Practitioner | Admitting: Nurse Practitioner

## 2023-04-27 DIAGNOSIS — Z1152 Encounter for screening for COVID-19: Secondary | ICD-10-CM | POA: Insufficient documentation

## 2023-04-27 DIAGNOSIS — Z87891 Personal history of nicotine dependence: Secondary | ICD-10-CM | POA: Diagnosis not present

## 2023-04-27 DIAGNOSIS — J069 Acute upper respiratory infection, unspecified: Secondary | ICD-10-CM | POA: Diagnosis not present

## 2023-04-27 DIAGNOSIS — J029 Acute pharyngitis, unspecified: Secondary | ICD-10-CM | POA: Diagnosis not present

## 2023-04-27 DIAGNOSIS — J45909 Unspecified asthma, uncomplicated: Secondary | ICD-10-CM | POA: Insufficient documentation

## 2023-04-27 DIAGNOSIS — B9789 Other viral agents as the cause of diseases classified elsewhere: Secondary | ICD-10-CM | POA: Insufficient documentation

## 2023-04-27 LAB — POCT RAPID STREP A (OFFICE): Rapid Strep A Screen: NEGATIVE

## 2023-04-27 MED ORDER — PROMETHAZINE-DM 6.25-15 MG/5ML PO SYRP: 5.0000 mL | ORAL_SOLUTION | Freq: Four times a day (QID) | ORAL | 0 refills | Status: DC | PRN

## 2023-04-27 NOTE — ED Provider Notes (Signed)
[facility]    CSN: [id number] Arrival date & time: 04/27/23  1445      History   Chief Complaint No chief complaint on file.   HPI Elijah Pennington is a 47 y.o. male  presents for evaluation of URI symptoms for 1 days. Patient reports associated symptoms of sore throat, cough, nasal drainage. Denies N/V/D, fevers, ear pain, body aches, shortness of breath. Patient does have a hx of asthma.  Has an inhaler but has not needed to use since symptom onset.  States his children has similar symptoms. pt has taken cold medicine OTC for symptoms. Pt has no other concerns at this time.   HPI  Past Medical History:  Diagnosis Date   Asthma    H/O intrinsic asthma    H/O: gout    Heart murmur    h/o, pt. 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   Hallux rigidus, acquired, left 08/15/2021   Radiculopathy 09/01/2020   Low back pain radiating to both legs 12/04/2017   Bilateral ankle pain 10/01/2017   GERD (gastroesophageal reflux disease) 05/20/2017   Nicotine abuse 05/20/2017   PCP NOTES >>>>>>>>>>>> 08/12/2016   S/P cervical spinal fusion 05/21/2016   Ulnar neuropathy of right upper extremity 05/21/2016   Lumbar radicular pain 05/21/2016   Pain in joint, upper arm 03/02/2013   Annual physical exam 08/25/2012    Past Surgical History:  Procedure Laterality Date   ANTERIOR CERVICAL DECOMP/DISCECTOMY FUSION  09/01/2020   Procedure: ANTERIOR CERVICAL DECOMPRESSION FUSION CERVICAL 5-6 WITH INSTRUMENTATION AND ALLOGRAFT, EXPLANT HARDWARE C6-C7;  Surgeon: Estill Bamberg, MD;  Location: [facility] OR;  Service: Orthopedics;;   Anterior cervical discectomy and fusion  08/04/2013       Home Medications    Prior to Admission medications   Medication Sig Start Date  End Date Taking? Authorizing Provider  promethazine-dextromethorphan (PROMETHAZINE-DM) 6.25-15 MG/5ML syrup Take 5 mLs by mouth 4 (four) times daily as needed for cough. 04/27/23  Yes Radford Pax, NP  albuterol (VENTOLIN HFA) 108 (90 Base) MCG/ACT inhaler Inhale 2 puffs into the lungs every 6 (six) hours as needed for wheezing or shortness of breath. 05/20/17   Wanda Plump, MD  methocarbamol (ROBAXIN) 500 MG tablet Take 500 mg by mouth. 11/08/22   [provider]  traMADol (ULTRAM) 50 MG tablet Take 50 mg by mouth. 11/04/22   [provider]    Family History Family History  Problem Relation Age of Onset   GI Disease Mother    Glaucoma Father    Parkinson's disease Father    Ulcerative colitis Brother    Breast cancer Maternal Grandmother    Healthy Son        x 3   CAD Neg Hx    Colon cancer Neg Hx    Prostate cancer Neg Hx    Diabetes Neg Hx     Social History Social History   Tobacco Use   Smoking status: Former    Years: 20    Types: Cigarettes    Quit date: 05/20/2013    Years since quitting: 9.9   Smokeless tobacco: Never   Tobacco comments:    1 ppd, quit ~ 2014, now vapes  Vaping Use   Vaping Use:  Every day  Substance Use Topics   Alcohol use: Yes    Alcohol/week: 0.0 standard drinks of alcohol    Comment: 8-10 drinks   Drug use: No     Allergies   Penicillins   Review of Systems Review of Systems  HENT:  Positive for congestion and sore throat.   Respiratory:  Positive for cough.      Physical Exam Triage Vital Signs ED Triage Vitals  Enc Vitals Group     BP 04/27/23 1526 135/85     Pulse Rate 04/27/23 1526 65     Resp 04/27/23 1526 16     Temp 04/27/23 1526 99.2 F (37.3 C)     Temp Source 04/27/23 1526 Oral     SpO2 04/27/23 1526 95 %     Weight --      Height --      Head Circumference --      Peak Flow --      Pain Score 04/27/23 1530 5     Pain Loc --      Pain Edu? --      Excl. in GC? --    No data  found.  Updated Vital Signs BP 135/85 (BP Location: Left Arm)   Pulse 65   Temp 99.2 F (37.3 C) (Oral)   Resp 16   SpO2 95%   Visual Acuity Right Eye Distance:   Left Eye Distance:   Bilateral Distance:    Right Eye Near:   Left Eye Near:    Bilateral Near:     Physical Exam Vitals and nursing note reviewed.  Constitutional:      General: He is not in acute distress.    Appearance: Normal appearance. He is not ill-appearing or toxic-appearing.  HENT:     Head: Normocephalic and atraumatic.     Right Ear: Tympanic membrane and ear canal normal.     Left Ear: Tympanic membrane and ear canal normal.     Nose: Congestion present.     Mouth/Throat:     Mouth: Mucous membranes are moist.     Pharynx: Posterior oropharyngeal erythema present.  Eyes:     Pupils: Pupils are equal, round, and reactive to light.  Cardiovascular:     Rate and Rhythm: Normal rate and regular rhythm.     Heart sounds: Normal heart sounds.  Pulmonary:     Effort: Pulmonary effort is normal.     Breath sounds: Normal breath sounds.  Musculoskeletal:     Cervical back: Normal range of motion and neck supple.  Lymphadenopathy:     Cervical: No cervical adenopathy.  Skin:    General: Skin is warm and dry.  Neurological:     General: No focal deficit present.     Mental Status: He is alert and oriented to person, place, and time.  Psychiatric:        Mood and Affect: Mood normal.        Behavior: Behavior normal.      UC Treatments / Results  Labs (all labs ordered are listed, but only abnormal results are displayed) Labs Reviewed  SARS CORONAVIRUS 2 (TAT 6-24 HRS)  CULTURE, GROUP A STREP [facility])  POCT RAPID STREP A (OFFICE)    EKG   Radiology No results found.  Procedures Procedures (including critical care time)  Medications Ordered in UC Medications - No data to display  Initial Impression / Assessment and Plan / UC Course  I have reviewed the triage vital  signs and the  nursing notes.  Pertinent labs & imaging results that were available during my care of the patient were reviewed by me and considered in my medical decision making (see chart for details).     COVID PCR and will contact if positive.  Negative rapid strep will culture Promethazine DM as needed for cough Salt water gargles and warm liquids PCP follow-up if symptoms do not improve ER precautions reviewed and patient verbalized understanding Final Clinical Impressions(s) / UC Diagnoses   Final diagnoses:  Sore throat  Viral upper respiratory illness     Discharge Instructions      Your rapid strep was negative.  We will send your COVID swab and contact you with those results if positive tomorrow Promethazine DM as needed for cough.  Please note this medication can make you drowsy.  Do not drink alcohol or drive on this medication Salt water gargles and warm liquids PCP follow-up if symptoms do not improve Please go to the ER for any worsening symptoms     ED Prescriptions     Medication Sig Dispense Auth. Provider   promethazine-dextromethorphan (PROMETHAZINE-DM) 6.25-15 MG/5ML syrup Take 5 mLs by mouth 4 (four) times daily as needed for cough. 118 mL Radford Pax, NP      PDMP not reviewed this encounter.   Radford Pax, NP 04/27/23 1550

## 2023-04-27 NOTE — Discharge Instructions (Signed)
Your rapid strep was negative.  We will send your COVID swab and contact you with those results if positive tomorrow Promethazine DM as needed for cough.  Please note this medication can make you drowsy.  Do not drink alcohol or drive on this medication Salt water gargles and warm liquids PCP follow-up if symptoms do not improve Please go to the ER for any worsening symptoms

## 2023-04-27 NOTE — ED Triage Notes (Signed)
Pt presents to UC w/ c/o sore throat, nasal drainage, cough since yesterday.

## 2023-04-28 LAB — SARS CORONAVIRUS 2 (TAT 6-24 HRS): SARS Coronavirus 2: NEGATIVE

## 2023-04-30 LAB — CULTURE, GROUP A STREP (THRC)

## 2023-05-07 DIAGNOSIS — M47816 Spondylosis without myelopathy or radiculopathy, lumbar region: Secondary | ICD-10-CM | POA: Diagnosis not present

## 2023-05-24 DIAGNOSIS — M47816 Spondylosis without myelopathy or radiculopathy, lumbar region: Secondary | ICD-10-CM | POA: Diagnosis not present

## 2023-06-08 DIAGNOSIS — M5441 Lumbago with sciatica, right side: Secondary | ICD-10-CM | POA: Diagnosis not present

## 2023-06-17 DIAGNOSIS — M47816 Spondylosis without myelopathy or radiculopathy, lumbar region: Secondary | ICD-10-CM | POA: Diagnosis not present

## 2023-07-01 DIAGNOSIS — J312 Chronic pharyngitis: Secondary | ICD-10-CM | POA: Diagnosis not present

## 2023-07-11 DIAGNOSIS — M79641 Pain in right hand: Secondary | ICD-10-CM | POA: Diagnosis not present

## 2023-07-11 DIAGNOSIS — M25531 Pain in right wrist: Secondary | ICD-10-CM | POA: Diagnosis not present

## 2023-07-15 DIAGNOSIS — M47816 Spondylosis without myelopathy or radiculopathy, lumbar region: Secondary | ICD-10-CM | POA: Diagnosis not present

## 2023-07-27 ENCOUNTER — Encounter (HOSPITAL_BASED_OUTPATIENT_CLINIC_OR_DEPARTMENT_OTHER): Payer: Self-pay

## 2023-07-27 ENCOUNTER — Emergency Department (HOSPITAL_BASED_OUTPATIENT_CLINIC_OR_DEPARTMENT_OTHER)
Admission: EM | Admit: 2023-07-27 | Discharge: 2023-07-27 | Disposition: A | Discharge status: Home / Self Care | Payer: BC Managed Care – PPO | Attending: Emergency Medicine | Admitting: Emergency Medicine

## 2023-07-27 ENCOUNTER — Emergency Department (HOSPITAL_BASED_OUTPATIENT_CLINIC_OR_DEPARTMENT_OTHER): Payer: BC Managed Care – PPO

## 2023-07-27 DIAGNOSIS — S7002XA Contusion of left hip, initial encounter: Secondary | ICD-10-CM | POA: Diagnosis not present

## 2023-07-27 DIAGNOSIS — S92412A Displaced fracture of proximal phalanx of left great toe, initial encounter for closed fracture: Secondary | ICD-10-CM | POA: Diagnosis not present

## 2023-07-27 DIAGNOSIS — S9032XA Contusion of left foot, initial encounter: Secondary | ICD-10-CM | POA: Diagnosis not present

## 2023-07-27 DIAGNOSIS — M19072 Primary osteoarthritis, left ankle and foot: Secondary | ICD-10-CM | POA: Diagnosis not present

## 2023-07-27 DIAGNOSIS — M79672 Pain in left foot: Secondary | ICD-10-CM | POA: Diagnosis not present

## 2023-07-27 DIAGNOSIS — W1839XA Other fall on same level, initial encounter: Secondary | ICD-10-CM | POA: Diagnosis not present

## 2023-07-27 DIAGNOSIS — M25552 Pain in left hip: Secondary | ICD-10-CM | POA: Diagnosis not present

## 2023-07-27 DIAGNOSIS — S79912A Unspecified injury of left hip, initial encounter: Secondary | ICD-10-CM | POA: Diagnosis not present

## 2023-07-27 DIAGNOSIS — S90112A Contusion of left great toe without damage to nail, initial encounter: Secondary | ICD-10-CM | POA: Diagnosis not present

## 2023-07-27 MED ORDER — CELECOXIB 200 MG PO CAPS: 200.0000 mg | ORAL_CAPSULE | Freq: Two times a day (BID) | ORAL | 0 refills | Status: DC

## 2023-07-27 MED ORDER — TRAMADOL HCL 50 MG PO TABS
50.0000 mg | ORAL_TABLET | Freq: Four times a day (QID) | ORAL | 0 refills | Status: DC | PRN
Start: 2023-07-27 — End: 2023-09-03

## 2023-07-27 NOTE — Discharge Instructions (Addendum)
Contact a health care provider if: Your symptoms do not improve after several days of treatment. Your symptoms get worse. You have difficulty moving the injured area. Get help right away if: You have severe pain. You have numbness in a hand or foot. Your hand or foot turns pale or cold. 

## 2023-07-27 NOTE — ED Triage Notes (Signed)
The patient twisted his left ankle and fell two days ago. He is having lower back pain, left hip, ankle and great toe pain. No head injury or LOC.

## 2023-07-27 NOTE — ED Provider Notes (Signed)
[facility] Provider Note   CSN: [id number] Arrival date & time: 07/27/23  1000     History  Chief Complaint  Patient presents with   Leg Injury   Fall    Elijah Pennington is a 47 y.o. male who presents emergency department for evaluation of injury.  Patient states that he is "pretty sure I broke my left big toe again."  Patient states that he injured his toe 2 days ago after twisting his foot.  He complains of pain at the great toe and MTP joint he also then fell onto his left hip.  He has been able to ambulate but complains of pain in the left hip which is worse when he internally rotates and bears weight on the left leg.  He has been using a cane and taking Tylenol and ibuprofen.  He has no chronic back problems for which she takes a muscle relaxer and states that this is certainly worse as his chronic issues.  He denies numbness or tingling or weakness   Fall       Home Medications Prior to Admission medications   Medication Sig Start Date End Date Taking? Authorizing Provider  celecoxib (CELEBREX) 200 MG capsule Take 1 capsule (200 mg total) by mouth 2 (two) times daily. 07/27/23  Yes Adnan Vanvoorhis, PA-C  traMADol (ULTRAM) 50 MG tablet Take 1 tablet (50 mg total) by mouth every 6 (six) hours as needed. 07/27/23  Yes Tyronne Blann, PA-C  albuterol (VENTOLIN HFA) 108 (90 Base) MCG/ACT inhaler Inhale 2 puffs into the lungs every 6 (six) hours as needed for wheezing or shortness of breath. 05/20/17   Wanda Plump, MD  methocarbamol (ROBAXIN) 500 MG tablet Take 500 mg by mouth. 11/08/22   [provider]  promethazine-dextromethorphan (PROMETHAZINE-DM) 6.25-15 MG/5ML syrup Take 5 mLs by mouth 4 (four) times daily as needed for cough. 04/27/23   Radford Pax, NP  traMADol Janean Sark) 50 MG tablet Take 50 mg by mouth. 11/04/22   [provider]      Allergies    Penicillins    Review of Systems   Review of  Systems  Physical Exam Updated Vital Signs BP (!) 199/96   Pulse (!) 53   Temp 98.2 F (36.8 C) (Oral)   Resp 16   Ht 5\' 10"  (1.778 m)   Wt 90.7 kg   SpO2 99%   BMI 28.70 kg/m  Physical Exam Vitals and nursing note reviewed.  Constitutional:      General: He is not in acute distress.    Appearance: He is well-developed. He is not diaphoretic.  HENT:     Head: Normocephalic and atraumatic.  Eyes:     General: No scleral icterus.    Conjunctiva/sclera: Conjunctivae normal.  Cardiovascular:     Rate and Rhythm: Normal rate and regular rhythm.     Heart sounds: Normal heart sounds.  Pulmonary:     Effort: Pulmonary effort is normal. No respiratory distress.     Breath sounds: Normal breath sounds.  Abdominal:     Palpations: Abdomen is soft.     Tenderness: There is no abdominal tenderness.  Musculoskeletal:     Cervical back: Normal range of motion and neck supple.     Comments: Bruising on the plantar and dorsal surface of his left big toe.  Flexor and extensor mechanisms appear to be intact.  No midfoot tenderness, full range of motion without pain of the  left ankle, DP PT pulse 2+ with normal capillary refill.  No pain with flexion extension by passive range of motion of the left knee or hip however pain with passive internal rotation of the left hip and active flexion.  Skin:    General: Skin is warm and dry.  Neurological:     Mental Status: He is alert.  Psychiatric:        Behavior: Behavior normal.     ED Results / Procedures / Treatments   Labs (all labs ordered are listed, but only abnormal results are displayed) Labs Reviewed - No data to display  EKG None  Radiology DG Foot Complete Left  Result Date: 07/27/2023 CLINICAL DATA:  Trauma. Fall 36 hours ago downstairs. Left great toe pain. Toe is bruised from distal metatarsal through tip of toe. EXAM: LEFT FOOT - COMPLETE 3+ VIEW COMPARISON:  Left great toe radiographs 07/18/2012 FINDINGS: Interval  healing of the prior intra-articular fracture at the dorsal base of the distal phalanx of the great toe. Mild-to-moderate great toe metatarsophalangeal joint space narrowing and lateral and dorsal degenerative osteophytes, mildly worsened from prior remote 2013 radiographs. Small plantar and posterior calcaneal heel spurs. No acute fracture or dislocation. IMPRESSION: 1. No acute fracture or dislocation. 2. 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 Electronically Signed   By: Neita Garnet M.D.   On: 07/27/2023 11:41   DG Hip Unilat With Pelvis 2-3 Views Left  Result Date: 07/27/2023 CLINICAL DATA:  Trauma.  Fall 36 hours ago.  Left hip pain. EXAM: DG HIP (WITH OR WITHOUT PELVIS) 2-3V LEFT COMPARISON:  MRI right hip 04/15/2022 FINDINGS: Interval placement of two transverse fusion bars overlying the superior right sacroiliac joint. The sacroiliac joint spaces appear maintained. The bilateral femoroacetabular and pubic symphysis joint spaces appear maintained. 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 No acute fracture or dislocation. IMPRESSION: 1. No acute fracture or dislocation. 2. 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 Electronically Signed   By: Neita Garnet M.D.   On: 07/27/2023 11:40    Procedures Procedures    Medications Ordered in ED Medications - No data to display  ED Course/ Medical Decision Making/ A&P Clinical Course as of 07/27/23 1201  Sat Jul 27, 2023  1149 DG Foot Complete Left [AH]  1149 DG Hip Unilat With Pelvis 2-3 Views Left [AH]    Clinical Course User Index [AH] Arthor Captain, PA-C                                 Medical Decision  Making Amount and/or Complexity of Data Reviewed Radiology: ordered. Decision-making details documented in ED Course.   47 year old male who presents emergency department with chief complaint of left leg injury.  Images reviewed in ED course.  I visualized and interpreted both a left foot and left hip x-ray.  There is no evidence of acute fracture or dislocation however patient appears to have potential for impingement syndrome in the left hip.  Will refer to Ortho for further evaluation.  He has a cane is what he prefers to help him ambulate. PDMP reviewed during this visit.  Discharge with Celebrex and tramadol Patient follow-up and return precautions.  Final Clinical Impression(s) / ED Diagnoses Final diagnoses:  Contusion of left great toe without damage to nail, initial encounter  Contusion of left hip, initial encounter    Rx / DC Orders ED Discharge Orders          Ordered    traMADol (ULTRAM) 50 MG tablet  Every 6 hours PRN        07/27/23 1201    celecoxib (CELEBREX) 200 MG capsule  2 times daily        07/27/23 1201              Arthor Captain, PA-C 07/27/23 1201    Tanda Rockers A, DO 07/29/23 [id number]

## 2023-07-27 NOTE — ED Notes (Signed)
EDP notified of elevated BP. Pt is asymptomatic at this time. No new orders. Instructed pt per EDP to follow up with PCP. Pt states understanding. Reviewed discharge instructions and medications, Post op shoe applied. Pt able to ambulate Independently at discharge

## 2023-08-01 DIAGNOSIS — M79641 Pain in right hand: Secondary | ICD-10-CM | POA: Diagnosis not present

## 2023-08-05 DIAGNOSIS — M47816 Spondylosis without myelopathy or radiculopathy, lumbar region: Secondary | ICD-10-CM | POA: Diagnosis not present

## 2023-08-23 DIAGNOSIS — G5621 Lesion of ulnar nerve, right upper limb: Secondary | ICD-10-CM | POA: Diagnosis not present

## 2023-08-29 DIAGNOSIS — G5621 Lesion of ulnar nerve, right upper limb: Secondary | ICD-10-CM | POA: Diagnosis not present

## 2023-09-03 ENCOUNTER — Ambulatory Visit
Admission: EM | Admit: 2023-09-03 | Discharge: 2023-09-03 | Disposition: A | Discharge status: Home / Self Care | Payer: BC Managed Care – PPO | Attending: Internal Medicine | Admitting: Internal Medicine

## 2023-09-03 DIAGNOSIS — J988 Other specified respiratory disorders: Secondary | ICD-10-CM | POA: Diagnosis not present

## 2023-09-03 DIAGNOSIS — Z1152 Encounter for screening for COVID-19: Secondary | ICD-10-CM | POA: Insufficient documentation

## 2023-09-03 DIAGNOSIS — J4530 Mild persistent asthma, uncomplicated: Secondary | ICD-10-CM | POA: Diagnosis not present

## 2023-09-03 DIAGNOSIS — B9789 Other viral agents as the cause of diseases classified elsewhere: Secondary | ICD-10-CM | POA: Diagnosis not present

## 2023-09-03 MED ORDER — CETIRIZINE HCL 10 MG PO TABS
10.0000 mg | ORAL_TABLET | Freq: Every day | ORAL | 0 refills | Status: AC
Start: 2023-09-03 — End: ?

## 2023-09-03 MED ORDER — ALBUTEROL SULFATE HFA 108 (90 BASE) MCG/ACT IN AERS
2.0000 | INHALATION_SPRAY | Freq: Four times a day (QID) | RESPIRATORY_TRACT | 0 refills | Status: AC | PRN
Start: 2023-09-03 — End: ?

## 2023-09-03 MED ORDER — PREDNISONE 20 MG PO TABS: ORAL_TABLET | ORAL | 0 refills | Status: DC

## 2023-09-03 MED ORDER — PROMETHAZINE-DM 6.25-15 MG/5ML PO SYRP: 5.0000 mL | ORAL_SOLUTION | Freq: Three times a day (TID) | ORAL | 0 refills | Status: DC | PRN

## 2023-09-03 NOTE — ED Triage Notes (Signed)
Pt c/o prod cough, runny nose, diarrhea, wheezing started yesterday-last used albuterol inhaler 11am-NAD-steady gait

## 2023-09-03 NOTE — ED Provider Notes (Signed)
[facility]  Note:  This document was prepared using Conservation officer, historic buildings and may include unintentional dictation errors.  MRN: [id number] DOB: Feb 14, 1976  Subjective:   Elijah Pennington is a 47 y.o. male presenting for 1 day history of a productive cough, wheezing, chest tightness, runny nose, diarrhea.  Would like a COVID test.  Has a history of asthma, has been using his albuterol inhaler but needs a refill.  He vapes daily.  No history of CKD per patient.  No current facility-administered medications for this encounter.  Current Outpatient Medications:    albuterol (VENTOLIN HFA) 108 (90 Base) MCG/ACT inhaler, Inhale 2 puffs into the lungs every 6 (six) hours as needed for wheezing or shortness of breath., Disp: 1 Inhaler, Rfl: 1   celecoxib (CELEBREX) 200 MG capsule, Take 1 capsule (200 mg total) by mouth 2 (two) times daily., Disp: 20 capsule, Rfl: 0   methocarbamol (ROBAXIN) 500 MG tablet, Take 500 mg by mouth., Disp: , Rfl:    promethazine-dextromethorphan (PROMETHAZINE-DM) 6.25-15 MG/5ML syrup, Take 5 mLs by mouth 4 (four) times daily as needed for cough., Disp: 118 mL, Rfl: 0   traMADol (ULTRAM) 50 MG tablet, Take 50 mg by mouth., Disp: , Rfl:    traMADol (ULTRAM) 50 MG tablet, Take 1 tablet (50 mg total) by mouth every 6 (six) hours as needed., Disp: 15 tablet, Rfl: 0   Allergies  Allergen Reactions   Penicillins Hives    Past Medical History:  Diagnosis Date   Asthma    H/O intrinsic asthma    H/O: gout    Heart murmur    h/o, pt. denies   Radiculopathy of cervical region 2014   Secondary to new disc protrusion, s/p C6-7 ACDF 08/04/2013     Past Surgical History:  Procedure Laterality Date   ANTERIOR CERVICAL DECOMP/DISCECTOMY FUSION  09/01/2020   Procedure: ANTERIOR CERVICAL DECOMPRESSION FUSION CERVICAL 5-6 WITH INSTRUMENTATION AND ALLOGRAFT, EXPLANT HARDWARE C6-C7;  Surgeon: Estill Bamberg, MD;  Location: [facility] OR;  Service:  Orthopedics;;   Anterior cervical discectomy and fusion  08/04/2013    Family History  Problem Relation Age of Onset   GI Disease Mother    Glaucoma Father    Parkinson's disease Father    Ulcerative colitis Brother    Breast cancer Maternal Grandmother    Healthy Son        x 3   CAD Neg Hx    Colon cancer Neg Hx    Prostate cancer Neg Hx    Diabetes Neg Hx     Social History   Tobacco Use   Smoking status: Former    Current packs/day: 0.00    Types: Cigarettes    Start date: 05/20/1993    Quit date: 05/20/2013    Years since quitting: 10.2   Smokeless tobacco: Never   Tobacco comments:    1 ppd, quit ~ 2014, now vapes  Vaping Use   Vaping status: Every Day  Substance Use Topics   Alcohol use: Yes    Alcohol/week: 0.0 standard drinks of alcohol    Comment: 8-10 drinks   Drug use: No    ROS   Objective:   Vitals: BP (!) 157/105 (BP Location: Left Arm)   Pulse 68   Temp 99.2 F (37.3 C) (Oral)   Resp 20   SpO2 97%   Physical Exam Constitutional:      General: He is not in acute distress.    Appearance:  Normal appearance. He is well-developed and normal weight. He is not ill-appearing, toxic-appearing or diaphoretic.  HENT:     Head: Normocephalic and atraumatic.     Right Ear: Tympanic membrane, ear canal and external ear normal. No drainage, swelling or tenderness. No middle ear effusion. There is no impacted cerumen. Tympanic membrane is not erythematous or bulging.     Left Ear: Tympanic membrane, ear canal and external ear normal. No drainage, swelling or tenderness.  No middle ear effusion. There is no impacted cerumen. Tympanic membrane is not erythematous or bulging.     Nose: Congestion and rhinorrhea present.     Mouth/Throat:     Mouth: Mucous membranes are moist.     Pharynx: No oropharyngeal exudate or posterior oropharyngeal erythema.  Eyes:     General: No scleral icterus.       Right eye: No discharge.        Left eye: No discharge.      Extraocular Movements: Extraocular movements intact.     Conjunctiva/sclera: Conjunctivae normal.  Cardiovascular:     Rate and Rhythm: Normal rate and regular rhythm.     Heart sounds: Normal heart sounds. No murmur heard.    No friction rub. No gallop.  Pulmonary:     Effort: Pulmonary effort is normal. No respiratory distress.     Breath sounds: Normal breath sounds. No stridor. No wheezing, rhonchi or rales.  Musculoskeletal:     Cervical back: Normal range of motion and neck supple. No rigidity. No muscular tenderness.  Neurological:     General: No focal deficit present.     Mental Status: He is alert and oriented to person, place, and time.  Psychiatric:        Mood and Affect: Mood normal.        Behavior: Behavior normal.        Thought Content: Thought content normal.     Assessment and Plan :   PDMP not reviewed this encounter.  1. Viral respiratory illness   2. Mild persistent asthma without complication    Deferred imaging given clear cardiopulmonary exam, hemodynamically stable vital signs.  Refilled his albuterol inhaler, provided with a prescription for prednisone as a respiratory boost in the context of his asthma, smoking.  Will manage for viral illness such as viral URI, viral syndrome, viral rhinitis, COVID-19. Recommended supportive care. Offered scripts for symptomatic relief. Testing is pending. Counseled patient on potential for adverse effects with medications prescribed/recommended today, ER and return-to-clinic precautions discussed, patient verbalized understanding.   Patient will consider Paxlovid treatment should he test positive and would be important for patient as he has respiratory risk factors including his asthma, smoking.   Wallis Bamberg, [place] 09/03/23 1624

## 2023-09-03 NOTE — Discharge Instructions (Addendum)
We will notify you of your test results as they arrive and may take between about 24 hours.  I encourage you to sign up for MyChart if you have not already done so as this can be the easiest way for [place] to communicate results to you online or through a phone app.  Generally, we only contact you if it is a positive test result.  In the meantime, if you develop worsening symptoms including fever, chest pain, shortness of breath despite our current treatment plan then please report to the emergency room as this may be a sign of worsening status from possible viral infection.  Otherwise, we will manage this as a viral syndrome. For sore throat or cough try using a honey-based tea. Use 3 teaspoons of honey with juice squeezed from half lemon. Place shaved pieces of ginger into 1/2-1 cup of water and warm over stove top. Then mix the ingredients and repeat every 4 hours as needed. Please take Tylenol 650mg  every 6 hours for aches and pains, fevers. Hydrate very well with at least 2 liters of water. Eat light meals such as soups to replenish electrolytes and soft fruits, veggies. Start an antihistamine like Zyrtec (10mg  daily) for postnasal drainage, sinus congestion.  You can take this together with prednisone and albuterol.  Use the cough medications as needed.   I would recommend Paxlovid if you test positive for COVID 19 to help you clear the virus and prevent complications. If you have not heard from our results nurse by 2pm tomorrow, call our clinic directly and we will review everything for you.

## 2023-09-04 LAB — SARS CORONAVIRUS 2 (TAT 6-24 HRS): SARS Coronavirus 2: NEGATIVE

## 2023-09-06 DIAGNOSIS — M47816 Spondylosis without myelopathy or radiculopathy, lumbar region: Secondary | ICD-10-CM | POA: Diagnosis not present

## 2023-11-18 ENCOUNTER — Ambulatory Visit: Payer: BC Managed Care – PPO | Admitting: Family Medicine

## 2023-11-18 ENCOUNTER — Encounter: Payer: Self-pay | Admitting: Family Medicine

## 2023-11-18 VITALS — BP 172/96 | HR 72 | Ht 70.0 in | Wt 209.0 lb

## 2023-11-18 DIAGNOSIS — Z0000 Encounter for general adult medical examination without abnormal findings: Secondary | ICD-10-CM

## 2023-11-18 DIAGNOSIS — I10 Essential (primary) hypertension: Secondary | ICD-10-CM

## 2023-11-18 DIAGNOSIS — Z0001 Encounter for general adult medical examination with abnormal findings: Secondary | ICD-10-CM | POA: Diagnosis not present

## 2023-11-18 DIAGNOSIS — Z125 Encounter for screening for malignant neoplasm of prostate: Secondary | ICD-10-CM

## 2023-11-18 LAB — LIPID PANEL
Cholesterol: 203 mg/dL — ABNORMAL HIGH (ref 0–200)
HDL: 71.3 mg/dL (ref >39.00)
LDL Cholesterol: 120 mg/dL — ABNORMAL HIGH (ref 0–99)
NonHDL: 131.7
Total CHOL/HDL Ratio: 3
Triglycerides: 59 mg/dL (ref 0.0–149.0)
VLDL: 11.8 mg/dL (ref 0.0–40.0)

## 2023-11-18 LAB — CBC WITH DIFFERENTIAL/PLATELET
Basophils Absolute: 0 10*3/uL (ref 0.0–0.1)
Basophils Relative: 0.8 % (ref 0.0–3.0)
Eosinophils Absolute: 0.3 10*3/uL (ref 0.0–0.7)
Eosinophils Relative: 5.5 % — ABNORMAL HIGH (ref 0.0–5.0)
HCT: 45 % (ref 39.0–52.0)
Hemoglobin: 15.6 g/dL (ref 13.0–17.0)
Lymphocytes Relative: 35.7 % (ref 12.0–46.0)
Lymphs Abs: 2 10*3/uL (ref 0.7–4.0)
MCHC: 34.6 g/dL (ref 30.0–36.0)
MCV: 98.8 fL (ref 78.0–100.0)
Monocytes Absolute: 0.4 10*3/uL (ref 0.1–1.0)
Monocytes Relative: 8 % (ref 3.0–12.0)
Neutro Abs: 2.8 10*3/uL (ref 1.4–7.7)
Neutrophils Relative %: 50 % (ref 43.0–77.0)
Platelets: 188 10*3/uL (ref 150.0–400.0)
RBC: 4.55 Mil/uL (ref 4.22–5.81)
RDW: 13.6 % (ref 11.5–15.5)
WBC: 5.5 10*3/uL (ref 4.0–10.5)

## 2023-11-18 LAB — COMPREHENSIVE METABOLIC PANEL
ALT: 25 U/L (ref 0–53)
AST: 23 U/L (ref 0–37)
Albumin: 4.7 g/dL (ref 3.5–5.2)
Alkaline Phosphatase: 64 U/L (ref 39–117)
BUN: 10 mg/dL (ref 6–23)
CO2: 29 meq/L (ref 19–32)
Calcium: 9.2 mg/dL (ref 8.4–10.5)
Chloride: 102 meq/L (ref 96–112)
Creatinine, Ser: 0.98 mg/dL (ref 0.40–1.50)
GFR: 91.76 mL/min (ref >60.00)
Glucose, Bld: 100 mg/dL — ABNORMAL HIGH (ref 70–99)
Potassium: 4 meq/L (ref 3.5–5.1)
Sodium: 139 meq/L (ref 135–145)
Total Bilirubin: 0.6 mg/dL (ref 0.2–1.2)
Total Protein: 7.1 g/dL (ref 6.0–8.3)

## 2023-11-18 LAB — TSH: TSH: 5.35 u[IU]/mL (ref 0.35–5.50)

## 2023-11-18 LAB — PSA: PSA: 0.91 ng/mL (ref 0.10–4.00)

## 2023-11-18 MED ORDER — AMLODIPINE BESYLATE 5 MG PO TABS
5.0000 mg | ORAL_TABLET | Freq: Every day | ORAL | 3 refills | Status: DC
Start: 2023-11-18 — End: 2023-11-29

## 2023-11-18 NOTE — Patient Instructions (Addendum)
Blood pressure is not at goal for age and co-morbidities.   Recommendations: start amlodipine 5 mg daily; nurse visit BP check in 2-4 weeks - BP goal <130/80 - monitor and log blood pressures at home - check around the same time each day in a relaxed setting - Limit salt to <2000 mg/day - Follow DASH eating plan (heart healthy diet) - limit alcohol to 2 standard drinks per day for men and 1 per day for women - avoid tobacco products - get at least 2 hours of regular aerobic exercise weekly Patient aware of signs/symptoms requiring further/urgent evaluation. Labs updated today.

## 2023-11-18 NOTE — Assessment & Plan Note (Signed)
Blood pressure is not at goal for age and co-morbidities.   Recommendations: start amlodipine 5 mg daily; nurse visit BP check in 2-4 weeks - BP goal <130/80 - monitor and log blood pressures at home - check around the same time each day in a relaxed setting - Limit salt to <2000 mg/day - Follow DASH eating plan (heart healthy diet) - limit alcohol to 2 standard drinks per day for men and 1 per day for women - avoid tobacco products - get at least 2 hours of regular aerobic exercise weekly Patient aware of signs/symptoms requiring further/urgent evaluation. Labs updated today.

## 2023-11-18 NOTE — Progress Notes (Signed)
Labs are stable! Cholesterol is mildly elevated, but we can continue with lifestyle measures for now.   Lifestyle factors for lowering cholesterol include: Diet therapy - heart-healthy diet rich in fruits, veggies, fiber-rich whole grains, lean meats, chicken, fish (at least twice a week), fat-free or 1% dairy products; foods low in saturated/trans fats, cholesterol, sodium, and sugar. Mediterranean diet has shown to be very heart healthy. Regular exercise - recommend at least 30 minutes a day, 5 times per week Weight management    The 10-year ASCVD risk score (Arnett DK, et al., 2019) is: 3.4%   Values used to calculate the score:     Age: 47 years     Sex: Male     Is Non-Hispanic African American: No     Diabetic: No     Tobacco smoker: No     Systolic Blood Pressure: 172 mmHg     Is BP treated: Yes     HDL Cholesterol: 71.3 mg/dL     Total Cholesterol: 203 mg/dL

## 2023-11-18 NOTE — Progress Notes (Signed)
Complete physical exam  Patient: Elijah Pennington   DOB: [birth year]   47 y.o. Male  MRN: [id number]  Subjective:    Chief Complaint  Patient presents with   Annual Exam    Elijah Pennington is a 47 y.o. male who presents today for a complete physical exam. He reports consuming a general diet. The patient does not participate in regular exercise at present. He generally feels well. He reports sleeping fairly well. He does not have additional problems to discuss today.   Currently lives with: family  Acute concerns or interim problems since last visit: no  Vision concerns: no Dental concerns: no STD concerns: no  ETOH use: 10 servings per week Nicotine use: quit 47 years old Recreational drugs/illegal substances: no       Most recent fall risk assessment:    11/18/2023    8:56 AM  Fall Risk   Falls in the past year? 0  Number falls in past yr: 0  Injury with Fall? 0  Risk for fall due to : No Fall Risks  Follow up Falls evaluation completed     Most recent depression screenings:    11/18/2023    8:57 AM 11/14/2022    9:14 AM  PHQ 2/9 Scores  PHQ - 2 Score 0 0  PHQ- 9 Score 2 4            Patient Care Team: Clayborne Dana, NP as PCP - General (Family Medicine) Estill Bamberg, MD as Consulting Physician (Orthopedic Surgery)   Outpatient Medications Prior to Visit  Medication Sig   albuterol (VENTOLIN HFA) 108 (90 Base) MCG/ACT inhaler Inhale 2 puffs into the lungs every 6 (six) hours as needed for wheezing or shortness of breath.   cetirizine (ZYRTEC ALLERGY) 10 MG tablet Take 1 tablet (10 mg total) by mouth daily.   famotidine (PEPCID) 40 MG tablet Take by mouth.   methocarbamol (ROBAXIN) 500 MG tablet Take 500 mg by mouth.   omeprazole (PRILOSEC) 40 MG capsule Take by mouth.   [DISCONTINUED] predniSONE (DELTASONE) 20 MG tablet Take 2 tablets daily with breakfast.   [DISCONTINUED] promethazine-dextromethorphan (PROMETHAZINE-DM) 6.25-15 MG/5ML  syrup Take 5 mLs by mouth 3 (three) times daily as needed for cough.   No facility-administered medications prior to visit.    ROS All review of systems negative except what is listed in the HPI        Objective:     BP (!) 172/96   Pulse 72   Ht 5\' 10"  (1.778 m)   Wt 209 lb (94.8 kg)   SpO2 100%   BMI 29.99 kg/m    Physical Exam Vitals reviewed.  Constitutional:      General: He is not in acute distress.    Appearance: Normal appearance. He is not ill-appearing.  HENT:     Head: Normocephalic and atraumatic.     Right Ear: Tympanic membrane normal.     Left Ear: Tympanic membrane normal.     Nose: Nose normal.     Mouth/Throat:     Mouth: Mucous membranes are moist.     Pharynx: Oropharynx is clear.  Eyes:     Extraocular Movements: Extraocular movements intact.     Conjunctiva/sclera: Conjunctivae normal.     Pupils: Pupils are equal, round, and reactive to light.  Neck:     Vascular: No carotid bruit.  Cardiovascular:     Rate and Rhythm: Normal rate and regular rhythm.     Pulses:  Normal pulses.     Heart sounds: Normal heart sounds.  Pulmonary:     Effort: Pulmonary effort is normal.     Breath sounds: Normal breath sounds.  Abdominal:     General: Abdomen is flat. Bowel sounds are normal. There is no distension.     Palpations: Abdomen is soft. There is no mass.     Tenderness: There is no abdominal tenderness. There is no right CVA tenderness, left CVA tenderness, guarding or rebound.  Genitourinary:    Comments: Deferred exam Musculoskeletal:        General: Normal range of motion.     Cervical back: Normal range of motion and neck supple. No tenderness.     Right lower leg: No edema.     Left lower leg: No edema.  Lymphadenopathy:     Cervical: No cervical adenopathy.  Skin:    General: Skin is warm and dry.     Capillary Refill: Capillary refill takes less than 2 seconds.  Neurological:     General: No focal deficit present.     Mental  Status: He is alert and oriented to person, place, and time. Mental status is at baseline.  Psychiatric:        Mood and Affect: Mood normal.        Behavior: Behavior normal.        Thought Content: Thought content normal.        Judgment: Judgment normal.      No results found for any visits on 11/18/23.     Assessment & Plan:    Routine Health Maintenance and Physical Exam Discussed health promotion and safety including diet and exercise recommendations, dental health, and injury prevention. Tobacco cessation if applicable. Seat belts, sunscreen, smoke detectors, etc.    Immunization History  Administered Date(s) Administered   Influenza Split 08/25/2012   Influenza,inj,Quad PF,6+ Mos 08/25/2013, 08/10/2016, 09/20/2017, 10/03/2018, 11/14/2022   PFIZER(Purple Top)SARS-COV-2 Vaccination 02/16/2020, 03/08/2020, 11/14/2020   Tdap 08/10/2016    Health Maintenance  Topic Date Due   INFLUENZA VACCINE  02/24/2024 (Originally 06/27/2023)   COVID-19 Vaccine (4 - 2024-25 season) 11/14/2024 (Originally 07/28/2023)   Fecal DNA (Cologuard)  02/24/2026   DTaP/Tdap/Td (2 - Td or Tdap) 08/10/2026   Hepatitis C Screening  Completed   HIV Screening  Completed   HPV VACCINES  Aged Out        Problem List Items Addressed This Visit       Active Problems   Primary hypertension   Blood pressure is not at goal for age and co-morbidities.   Recommendations: start amlodipine 5 mg daily; nurse visit BP check in 2-4 weeks - BP goal <130/80 - monitor and log blood pressures at home - check around the same time each day in a relaxed setting - Limit salt to <2000 mg/day - Follow DASH eating plan (heart healthy diet) - limit alcohol to 2 standard drinks per day for men and 1 per day for women - avoid tobacco products - get at least 2 hours of regular aerobic exercise weekly Patient aware of signs/symptoms requiring further/urgent evaluation. Labs updated today.      Relevant Medications    amLODipine (NORVASC) 5 MG tablet   Other Relevant Orders   Comprehensive metabolic panel   Lipid panel   TSH   Other Visit Diagnoses       Encounter for routine adult health examination with abnormal findings    -  Primary   Relevant Orders  CBC with Differential/Platelet   Comprehensive metabolic panel   Lipid panel   PSA   TSH     Screening for prostate cancer       Relevant Orders   PSA      Return in about 2 weeks (around 12/02/2023) for BP check with nurse.     Clayborne Dana, NP

## 2023-11-29 ENCOUNTER — Ambulatory Visit (INDEPENDENT_AMBULATORY_CARE_PROVIDER_SITE_OTHER): Payer: BC Managed Care – PPO

## 2023-11-29 ENCOUNTER — Encounter: Payer: Self-pay | Admitting: Family Medicine

## 2023-11-29 DIAGNOSIS — I10 Essential (primary) hypertension: Secondary | ICD-10-CM | POA: Diagnosis not present

## 2023-11-29 MED ORDER — AMLODIPINE BESYLATE 5 MG PO TABS: 7.5000 mg | ORAL_TABLET | Freq: Every day | ORAL | 0 refills | Status: DC

## 2023-11-29 NOTE — Progress Notes (Signed)
 Pt here for Blood pressure check per W[place]ell  Pt currently takes: Amlodipine  5mg  once daily at night.   Pt reports compliance with medication.  BP today @ = 136/100 HR = 76  Pt advised per Waddell to increase to 7.5mg  and to follow up in 2 to 4 weeks. New Rx sent in. Pt scheduled for 12/13/23.

## 2023-12-13 ENCOUNTER — Other Ambulatory Visit (INDEPENDENT_AMBULATORY_CARE_PROVIDER_SITE_OTHER): Payer: BC Managed Care – PPO | Admitting: Emergency Medicine

## 2023-12-13 ENCOUNTER — Encounter: Payer: Self-pay | Admitting: Family Medicine

## 2023-12-13 ENCOUNTER — Ambulatory Visit (INDEPENDENT_AMBULATORY_CARE_PROVIDER_SITE_OTHER): Payer: BC Managed Care – PPO | Admitting: Family Medicine

## 2023-12-13 DIAGNOSIS — I10 Essential (primary) hypertension: Secondary | ICD-10-CM

## 2023-12-13 DIAGNOSIS — M79604 Pain in right leg: Secondary | ICD-10-CM

## 2023-12-13 DIAGNOSIS — M79605 Pain in left leg: Secondary | ICD-10-CM | POA: Diagnosis not present

## 2023-12-13 DIAGNOSIS — M545 Low back pain, unspecified: Secondary | ICD-10-CM

## 2023-12-13 DIAGNOSIS — M5416 Radiculopathy, lumbar region: Secondary | ICD-10-CM

## 2023-12-13 MED ORDER — KETOROLAC TROMETHAMINE 30 MG/ML IJ SOLN
30.0000 mg | Freq: Once | INTRAMUSCULAR | Status: AC
Start: 2023-12-13 — End: 2023-12-13
  Administered 2023-12-13: 30 mg via INTRAMUSCULAR

## 2023-12-13 NOTE — Addendum Note (Signed)
Addended by: Hyman Hopes B on: 12/13/2023 10:57 AM   Modules accepted: Level of Service

## 2023-12-13 NOTE — Progress Notes (Signed)
   Acute Office Visit  Subjective:     Patient ID: Elijah Pennington, male    DOB: Mar 14, 1976, 48 y.o.   MRN: [id number]  No chief complaint on file.   HPI Patient is in today for nurse visit BP check and back pain.   Patient was here for nurse visit BP check which was stable on current regimen, no changes needed. However, he was complaining of his chronic back pain flaring up. He is not able to get in with his spine specialist until next week and was wondering if we could give him a Toradol injection. I saw patient and evaluated him personally. No saddle paresthesias, numbness, radiation, new weakness, GI or GU changes. He has been using his Robaxin with only minimal relief.   ROS All review of systems negative except what is listed in the HPI      Objective:    There were no vitals taken for this visit. See nurse visit note. 136/88   Physical Exam Vitals reviewed.  Constitutional:      General: He is not in acute distress.    Appearance: Normal appearance. He is not ill-appearing.  Musculoskeletal:        General: No swelling or tenderness. Normal range of motion.     Right lower leg: No edema.     Left lower leg: No edema.  Skin:    General: Skin is warm and dry.  Neurological:     Mental Status: He is alert and oriented to person, place, and time.     Motor: No weakness.  Psychiatric:        Mood and Affect: Mood normal.        Behavior: Behavior normal.        Thought Content: Thought content normal.        Judgment: Judgment normal.     No results found for any visits on 12/13/23.      Assessment & Plan:   Problem List Items Addressed This Visit       Active Problems   Lumbar radicular pain   Pain has been flaring recently. Patient assessed.  Toradol injection today See nurse visit note.  Keep specialist appointment for next week.       Primary hypertension - Primary   Blood pressure is at goal for age and co-morbidities.   Recommendations:  continue amlodipine 7.5 mg daily - BP goal <130/80 - monitor and log blood pressures at home - check around the same time each day in a relaxed setting - Limit salt to <2000 mg/day - Follow DASH eating plan (heart healthy diet) - limit alcohol to 2 standard drinks per day for men and 1 per day for women - avoid tobacco products - get at least 2 hours of regular aerobic exercise weekly Patient aware of signs/symptoms requiring further/urgent evaluation.         No orders of the defined types were placed in this encounter.   Return in about 3 months (around 03/12/2024) for routine follow-up.  Clayborne Dana, NP

## 2023-12-13 NOTE — Assessment & Plan Note (Signed)
Pain has been flaring recently. Patient assessed.  Toradol injection today See nurse visit note.  Keep specialist appointment for next week.

## 2023-12-13 NOTE — Progress Notes (Signed)
BP Readings from Last 3 Encounters:  11/29/23 (!) 134/100  11/18/23 (!) 172/96  09/03/23 (!) 157/105    Pt here for Blood pressure check per Hyman Hopes  Pt currently takes: Amlodipine 5 mg tablet (1.5 daily) 7.5 total  Pt didn't take blood pressure this morning   Pt reports compliance with medication.  BP today @ =136/88  (Right arm)  HR =71  Pt advised per Ladona Ridgel, resume medication as prescribed. Monitor blood pressure readings at home. Follow up in 3 months.

## 2023-12-13 NOTE — Assessment & Plan Note (Signed)
Blood pressure is at goal for age and co-morbidities.   Recommendations: continue amlodipine 7.5 mg daily - BP goal <130/80 - monitor and log blood pressures at home - check around the same time each day in a relaxed setting - Limit salt to <2000 mg/day - Follow DASH eating plan (heart healthy diet) - limit alcohol to 2 standard drinks per day for men and 1 per day for women - avoid tobacco products - get at least 2 hours of regular aerobic exercise weekly Patient aware of signs/symptoms requiring further/urgent evaluation.

## 2023-12-16 NOTE — Progress Notes (Signed)
Pt received Ketorolac 30 mg in left deltoid for back pain.

## 2023-12-27 DIAGNOSIS — M47816 Spondylosis without myelopathy or radiculopathy, lumbar region: Secondary | ICD-10-CM | POA: Diagnosis not present

## 2023-12-30 ENCOUNTER — Other Ambulatory Visit: Payer: Self-pay | Admitting: Rehabilitation

## 2023-12-30 DIAGNOSIS — M5416 Radiculopathy, lumbar region: Secondary | ICD-10-CM

## 2024-01-11 ENCOUNTER — Other Ambulatory Visit: Payer: BC Managed Care – PPO

## 2024-01-17 ENCOUNTER — Ambulatory Visit
Admission: RE | Admit: 2024-01-17 | Discharge: 2024-01-17 | Disposition: A | Discharge status: Home / Self Care | Payer: BC Managed Care – PPO | Source: Ambulatory Visit | Attending: Rehabilitation | Admitting: Rehabilitation

## 2024-01-17 DIAGNOSIS — M5416 Radiculopathy, lumbar region: Secondary | ICD-10-CM

## 2024-02-06 DIAGNOSIS — M4726 Other spondylosis with radiculopathy, lumbar region: Secondary | ICD-10-CM | POA: Diagnosis not present

## 2024-02-07 DIAGNOSIS — M47816 Spondylosis without myelopathy or radiculopathy, lumbar region: Secondary | ICD-10-CM | POA: Insufficient documentation

## 2024-02-24 ENCOUNTER — Ambulatory Visit: Payer: BC Managed Care – PPO | Admitting: Family Medicine

## 2024-04-01 ENCOUNTER — Other Ambulatory Visit: Payer: Self-pay | Admitting: Family Medicine

## 2024-04-01 ENCOUNTER — Encounter: Payer: Self-pay | Admitting: Family Medicine

## 2024-04-01 DIAGNOSIS — I10 Essential (primary) hypertension: Secondary | ICD-10-CM

## 2024-04-02 ENCOUNTER — Other Ambulatory Visit: Payer: Self-pay | Admitting: Family Medicine

## 2024-04-02 DIAGNOSIS — I10 Essential (primary) hypertension: Secondary | ICD-10-CM

## 2024-04-02 MED ORDER — AMLODIPINE BESYLATE 5 MG PO TABS: 7.5000 mg | ORAL_TABLET | Freq: Every day | ORAL | 0 refills | Status: DC

## 2024-05-12 ENCOUNTER — Emergency Department (HOSPITAL_BASED_OUTPATIENT_CLINIC_OR_DEPARTMENT_OTHER)

## 2024-05-12 ENCOUNTER — Emergency Department (HOSPITAL_BASED_OUTPATIENT_CLINIC_OR_DEPARTMENT_OTHER): Admission: EM | Admit: 2024-05-12 | Discharge: 2024-05-12 | Disposition: A | Discharge status: Home / Self Care

## 2024-05-12 ENCOUNTER — Encounter (HOSPITAL_BASED_OUTPATIENT_CLINIC_OR_DEPARTMENT_OTHER): Payer: Self-pay | Admitting: *Deleted

## 2024-05-12 ENCOUNTER — Other Ambulatory Visit: Payer: Self-pay

## 2024-05-12 DIAGNOSIS — J45909 Unspecified asthma, uncomplicated: Secondary | ICD-10-CM | POA: Diagnosis not present

## 2024-05-12 DIAGNOSIS — R0789 Other chest pain: Secondary | ICD-10-CM

## 2024-05-12 DIAGNOSIS — I10 Essential (primary) hypertension: Secondary | ICD-10-CM | POA: Diagnosis not present

## 2024-05-12 DIAGNOSIS — Z79899 Other long term (current) drug therapy: Secondary | ICD-10-CM | POA: Insufficient documentation

## 2024-05-12 HISTORY — DX: Essential (primary) hypertension: I10

## 2024-05-12 LAB — TROPONIN T, HIGH SENSITIVITY: Troponin T High Sensitivity: <15 ng/L (ref <19)

## 2024-05-12 LAB — CBC
HCT: 44.9 % (ref 39.0–52.0)
Hemoglobin: 15.8 g/dL (ref 13.0–17.0)
MCH: 33.3 pg (ref 26.0–34.0)
MCHC: 35.2 g/dL (ref 30.0–36.0)
MCV: 94.5 fL (ref 80.0–100.0)
Platelets: 171 10*3/uL (ref 150–400)
RBC: 4.75 MIL/uL (ref 4.22–5.81)
RDW: 12.8 % (ref 11.5–15.5)
WBC: 5.5 10*3/uL (ref 4.0–10.5)
nRBC: 0 % (ref 0.0–0.2)

## 2024-05-12 LAB — BASIC METABOLIC PANEL WITH GFR
Anion gap: 12 (ref 5–15)
BUN: 9 mg/dL (ref 6–20)
CO2: 26 mmol/L (ref 22–32)
Calcium: 9.4 mg/dL (ref 8.9–10.3)
Chloride: 101 mmol/L (ref 98–111)
Creatinine, Ser: 0.9 mg/dL (ref 0.61–1.24)
GFR, Estimated: >60 mL/min (ref >60)
Glucose, Bld: 138 mg/dL — ABNORMAL HIGH (ref 70–99)
Potassium: 3.7 mmol/L (ref 3.5–5.1)
Sodium: 138 mmol/L (ref 135–145)

## 2024-05-12 MED ORDER — AMLODIPINE BESYLATE 10 MG PO TABS: 10.0000 mg | ORAL_TABLET | Freq: Every day | ORAL | 1 refills | Status: AC

## 2024-05-12 NOTE — Discharge Instructions (Signed)
 Your workup today was reassuring.  Please take the higher dose of the amlodipine  as prescribed and follow-up with your doctor to have this rechecked and to see if they would like to change your medication or perhaps start a new one.  The higher doses of amlodipine  can cause some leg swelling.  If you notice this please call your doctor to see if they will like to change you to a different medication.  Please return to the emergency department for worsening symptoms.

## 2024-05-12 NOTE — ED Triage Notes (Addendum)
 Pt is here for evaluation of hypertension.  Pt states that he takes BP medication as prescribed but has been having elevated BP, 170/100 at home.  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  BP during triage 165/103.  Pt takes amlodipine  7.5mg  daily

## 2024-05-12 NOTE — ED Provider Notes (Signed)
 [facility] Provider Note   CSN: [id number] Arrival date & time: 05/12/24  1013     Patient presents with: Hypertension   Elijah Pennington is a 48 y.o. male.   48 year old male with past medical history of asthma and hypertension presenting to the emergency department today with chest pressure and intermittent headaches.  The patient states this been going now for the past week or so.  He has been checking his blood pressure and has been elevated.  He has been taking amlodipine  7.5 mg daily as prescribed.  He states that these are the symptoms he has had in the past when his blood pressure has been elevated.  He checked his blood pressure this morning and it was elevated so he came to the ER for further evaluation.  He denies any leg pain or swelling.  Denies any severe chest pain.  States that this is more of a pressure sensation.  He denies any focal weakness, numbness, or tingling.   Hypertension Associated symptoms include chest pain.       Prior to Admission medications   Medication Sig Start Date End Date Taking? Authorizing Provider  amLODipine  (NORVASC ) 10 MG tablet Take 1 tablet (10 mg total) by mouth daily. 05/12/24  Yes Carin Charleston, MD  albuterol  (VENTOLIN  HFA) 108 (90 Base) MCG/ACT inhaler Inhale 2 puffs into the lungs every 6 (six) hours as needed for wheezing or shortness of breath. 09/03/23   Adolph Hoop, PA-C  cetirizine  (ZYRTEC  ALLERGY) 10 MG tablet Take 1 tablet (10 mg total) by mouth daily. 09/03/23   Adolph Hoop, PA-C  famotidine (PEPCID) 40 MG tablet Take by mouth. 07/01/23 06/30/24  [provider]  methocarbamol  (ROBAXIN ) 500 MG tablet Take 500 mg by mouth. 11/08/22   [provider]  omeprazole  (PRILOSEC) 40 MG capsule Take by mouth. 07/01/23 06/30/24  [provider]  pregabalin (LYRICA) 50 MG capsule Take 50-100 mg by mouth at bedtime. 12/27/23   [provider]    Allergies: Penicillins     Review of Systems  Cardiovascular:  Positive for chest pain.  All other systems reviewed and are negative.   Updated Vital Signs BP (!) 150/89   Pulse 77   Temp 98.8 F (37.1 C)   Resp (!) 21   SpO2 98%   Physical Exam Vitals and nursing note reviewed.   Gen: NAD Eyes: PERRL, EOMI HEENT: no oropharyngeal swelling Neck: trachea midline Resp: clear to auscultation bilaterally Card: RRR, no murmurs, rubs, or gallops Abd: nontender, nondistended Extremities: no calf tenderness, no edema Vascular: 2+ radial pulses bilaterally, 2+ DP pulses bilaterally Neuro: Cranial nerves intact, equal strength and sensation throughout bilateral upper and lower extremities with no dysmetria on finger-to-nose testing Skin: no rashes Psyc: acting appropriately   (all labs ordered are listed, but only abnormal results are displayed) Labs Reviewed  BASIC METABOLIC PANEL WITH GFR - Abnormal; Notable for the following components:      Result Value   Glucose, Bld 138 (*)    All other components within normal limits  CBC  TROPONIN T, HIGH SENSITIVITY    EKG: None  Radiology: [facility] Chest Port 1 View Result Date: 05/12/2024 CLINICAL DATA:  Chest pressure EXAM: PORTABLE CHEST - 1 VIEW COMPARISON:  08/30/2020. FINDINGS: Cardiac silhouette is unremarkable. No pneumothorax or pleural effusion. The lungs are clear. The visualized skeletal structures are unremarkable. IMPRESSION: No acute cardiopulmonary process. Electronically Signed   By: Sydell Eva  M.D.   On: 05/12/2024 13:12     Procedures   Medications Ordered in the ED - No data to display                                  Medical Decision Making 48 year old male with past medical history of hypertension and asthma presenting to the emergency department today with elevated blood pressure.  Will further evaluate patient here with basic labs Wels and EKG, chest x-ray, and troponin to evaluate for pulmonary edema, pulmonary infiltrates,  pneumothorax, ACS, or endorgan dysfunction.  The patient's neurologic exam is reassuring.  Blood pressure is mildly elevated here.  I think that if his workup is reassuring that he may be safely discharged follow-up with his primary care provider.  The patient's workup here is reassuring.  There are no findings consistent with endorgan dysfunction.  The patient is discharged with primary care follow-up.  Given his elevated blood pressure here will increase his amlodipine  dose to 10 mg daily.  Amount and/or Complexity of Data Reviewed Labs: ordered. Radiology: ordered.  Risk Prescription drug management.        Final diagnoses:  Hypertension, unspecified type  Chest pressure    ED Discharge Orders          Ordered    amLODipine  (NORVASC ) 10 MG tablet  Daily        05/12/24 1330               Carin Charleston, MD 05/12/24 1332

## 2025-01-22 ENCOUNTER — Ambulatory Visit
